# Patient Record
Sex: Male | Born: 1940 | Race: White | Hispanic: No | Marital: Married | Smoking: Never smoker
Health system: Southern US, Community
[De-identification: ages and names within clinical notes are randomized; demographics above are authoritative.]

## PROBLEM LIST (undated history)

## (undated) DIAGNOSIS — E785 Hyperlipidemia, unspecified: Secondary | ICD-10-CM

## (undated) DIAGNOSIS — Z789 Other specified health status: Secondary | ICD-10-CM

## (undated) DIAGNOSIS — F1090 Alcohol use, unspecified, uncomplicated: Secondary | ICD-10-CM

## (undated) DIAGNOSIS — G250 Essential tremor: Secondary | ICD-10-CM

## (undated) DIAGNOSIS — I10 Essential (primary) hypertension: Secondary | ICD-10-CM

## (undated) HISTORY — DX: Other specified health status: Z78.9

## (undated) HISTORY — DX: Essential tremor: G25.0

## (undated) HISTORY — DX: Hyperlipidemia, unspecified: E78.5

## (undated) HISTORY — DX: Alcohol use, unspecified, uncomplicated: F10.90

---

## 2001-01-18 ENCOUNTER — Encounter: Admission: RE | Admit: 2001-01-18 | Discharge: 2001-04-18 | Payer: Self-pay | Admitting: Dermatology

## 2002-10-12 ENCOUNTER — Emergency Department (HOSPITAL_COMMUNITY): Admission: EM | Admit: 2002-10-12 | Discharge: 2002-10-12 | Payer: Self-pay | Admitting: Emergency Medicine

## 2002-12-23 ENCOUNTER — Ambulatory Visit (HOSPITAL_COMMUNITY): Admission: RE | Admit: 2002-12-23 | Discharge: 2002-12-23 | Payer: Self-pay | Admitting: Gastroenterology

## 2002-12-23 ENCOUNTER — Encounter (INDEPENDENT_AMBULATORY_CARE_PROVIDER_SITE_OTHER): Payer: Self-pay | Admitting: Specialist

## 2006-01-02 ENCOUNTER — Encounter: Admission: RE | Admit: 2006-01-02 | Discharge: 2006-01-02 | Payer: Self-pay | Admitting: Geriatric Medicine

## 2008-09-10 ENCOUNTER — Encounter: Admission: RE | Admit: 2008-09-10 | Discharge: 2008-09-10 | Payer: Self-pay | Admitting: Geriatric Medicine

## 2009-11-05 ENCOUNTER — Inpatient Hospital Stay (HOSPITAL_COMMUNITY): Admission: RE | Admit: 2009-11-05 | Discharge: 2009-11-07 | Payer: Self-pay | Admitting: Orthopedic Surgery

## 2011-03-01 LAB — BASIC METABOLIC PANEL
BUN: 11 mg/dL (ref 6–23)
BUN: 11 mg/dL (ref 6–23)
GFR calc Af Amer: >60 mL/min (ref >60)
GFR calc non Af Amer: >60 mL/min (ref >60)
Potassium: 3.6 mEq/L (ref 3.5–5.1)
Potassium: 3.7 mEq/L (ref 3.5–5.1)

## 2011-03-01 LAB — CBC
HCT: 33.4 % — ABNORMAL LOW (ref 39.0–52.0)
HCT: 47.5 % (ref 39.0–52.0)
Hemoglobin: 12.1 g/dL — ABNORMAL LOW (ref 13.0–17.0)
MCHC: 33.6 g/dL (ref 30.0–36.0)
MCHC: 33.9 g/dL (ref 30.0–36.0)
MCHC: 34.3 g/dL (ref 30.0–36.0)
MCV: 98.1 fL (ref 78.0–100.0)
Platelets: 132 10*3/uL — ABNORMAL LOW (ref 150–400)
RBC: 3.64 MIL/uL — ABNORMAL LOW (ref 4.22–5.81)
RDW: 13 % (ref 11.5–15.5)
WBC: 4.8 10*3/uL (ref 4.0–10.5)
WBC: 6.2 10*3/uL (ref 4.0–10.5)

## 2011-03-01 LAB — PROTIME-INR: Prothrombin Time: 13.5 seconds (ref 11.6–15.2)

## 2011-03-01 LAB — ABO/RH: ABO/RH(D): O POS

## 2011-03-01 LAB — COMPREHENSIVE METABOLIC PANEL
ALT: 22 U/L (ref 0–53)
Albumin: 4.1 g/dL (ref 3.5–5.2)
Alkaline Phosphatase: 45 U/L (ref 39–117)
CO2: 29 mEq/L (ref 19–32)
Calcium: 9.4 mg/dL (ref 8.4–10.5)
Chloride: 105 mEq/L (ref 96–112)
Creatinine, Ser: 1.01 mg/dL (ref 0.4–1.5)
GFR calc non Af Amer: >60 mL/min (ref >60)
Sodium: 142 mEq/L (ref 135–145)
Total Bilirubin: 1.4 mg/dL — ABNORMAL HIGH (ref 0.3–1.2)

## 2011-03-01 LAB — DIFFERENTIAL
Basophils Absolute: 0 10*3/uL (ref 0.0–0.1)
Neutro Abs: 2.3 10*3/uL (ref 1.7–7.7)
Neutrophils Relative %: 49 % (ref 43–77)

## 2011-03-01 LAB — URINALYSIS, ROUTINE W REFLEX MICROSCOPIC
Bilirubin Urine: NEGATIVE
Glucose, UA: NEGATIVE mg/dL
Ketones, ur: NEGATIVE mg/dL
Protein, ur: NEGATIVE mg/dL
Urobilinogen, UA: 0.2 mg/dL (ref 0.0–1.0)
pH: 6.5 (ref 5.0–8.0)

## 2011-03-01 LAB — CROSSMATCH

## 2011-04-15 NOTE — Op Note (Signed)
   [name], Victor Mcbride                        ACCOUNT NO.:  [id number]   MEDICAL RECORD NO.:  [id number]                   PATIENT TYPE:  AMB   LOCATION:  ENDO                                 FACILITY:  [facility]   PHYSICIAN:  Danise Edge, M.D.                DATE OF BIRTH:  [birth year]   DATE OF PROCEDURE:  12/23/2002  DATE OF DISCHARGE:                                 OPERATIVE REPORT   REFERRING PHYSICIAN:  Hal T. Stoneking, M.D.   PROCEDURE:  Screening colonoscopy.   PROCEDURE INDICATION:  The patient is a 70 year old male, born 07/26/1941.  The  patient is scheduled undergo his first screening colonoscopy with  polypectomy to prevent colon cancer.  By screening flexible  proctosigmoidoscopy, the patient had left colonic diverticulosis.   I have discussed with the patient the complications associated with  colonoscopy and polypectomy including a 15:1000 risk of bleeding and 1:1000  risk of colon perforation.  The patient has signed the operative permit.   ENDOSCOPIST:  Danise Edge, M.D.   PREMEDICATION:  Versed 7 mg, Demerol 50 mg.   ENDOSCOPE:  Olympus pediatric colonoscope.   DESCRIPTION OF PROCEDURE:  After obtaining informed consent, the patient was  placed in the left lateral decubitus position.  I administered intravenous  Demerol and intravenous Versed to achieve conscious sedation for the  procedure.  The patient's blood pressure, oxygen saturation, and cardiac  rhythm were monitored throughout the procedure and documented in the medical  record.   Anal inspection was normal.  Digital rectal exam revealed a non-nodular  prostate.  The Olympus pediatric videocolonoscope was introduced into the  rectum and advanced to the cecum.  The colonic preparation for the exam  today was excellent.   Rectum:  Normal.   Sigmoid colon and descending colon:  Extensive left colonic diverticulosis.  At 30 cm from the anal verge, a diminutive polyp was removed with the cold  biopsy forceps.   Splenic flexure:  Normal.   Transverse colon:  Normal.   Hepatic flexure:  Normal.   Ascending colon:  Normal.   Cecum and ileocecal valve:  Normal.   ASSESSMENT:  1. A diminutive polyp was removed from the distal sigmoid colon.  2. Extensive left colonic diverticulosis.   RECOMMENDATIONS:  If sigmoid colon polyp returns neoplastic pathologically,  the patient should undergo a repeat colonoscopy in five years.                                               Danise Edge, M.D.    MJ/MEDQ  D:  12/23/2002  T:  12/23/2002  Job:  [id number]   cc:   Hal T. Stoneking, M.D.  [address] [place], [place] [address]  Fax: [phone]

## 2012-01-17 DIAGNOSIS — M25519 Pain in unspecified shoulder: Secondary | ICD-10-CM | POA: Diagnosis not present

## 2012-02-07 DIAGNOSIS — M19019 Primary osteoarthritis, unspecified shoulder: Secondary | ICD-10-CM | POA: Diagnosis not present

## 2012-02-10 DIAGNOSIS — M25519 Pain in unspecified shoulder: Secondary | ICD-10-CM | POA: Diagnosis not present

## 2012-02-15 DIAGNOSIS — M25519 Pain in unspecified shoulder: Secondary | ICD-10-CM | POA: Diagnosis not present

## 2012-02-15 DIAGNOSIS — I10 Essential (primary) hypertension: Secondary | ICD-10-CM | POA: Diagnosis not present

## 2012-02-20 DIAGNOSIS — J40 Bronchitis, not specified as acute or chronic: Secondary | ICD-10-CM | POA: Diagnosis not present

## 2012-03-15 DIAGNOSIS — M19019 Primary osteoarthritis, unspecified shoulder: Secondary | ICD-10-CM | POA: Diagnosis not present

## 2012-03-15 DIAGNOSIS — M75100 Unspecified rotator cuff tear or rupture of unspecified shoulder, not specified as traumatic: Secondary | ICD-10-CM | POA: Diagnosis not present

## 2012-03-15 DIAGNOSIS — S43429A Sprain of unspecified rotator cuff capsule, initial encounter: Secondary | ICD-10-CM | POA: Diagnosis not present

## 2012-03-15 DIAGNOSIS — M659 Synovitis and tenosynovitis, unspecified: Secondary | ICD-10-CM | POA: Diagnosis not present

## 2012-03-15 DIAGNOSIS — M9420 Chondromalacia, unspecified site: Secondary | ICD-10-CM | POA: Diagnosis not present

## 2012-03-15 DIAGNOSIS — M719 Bursopathy, unspecified: Secondary | ICD-10-CM | POA: Diagnosis not present

## 2012-03-15 DIAGNOSIS — M67919 Unspecified disorder of synovium and tendon, unspecified shoulder: Secondary | ICD-10-CM | POA: Diagnosis not present

## 2012-03-26 DIAGNOSIS — M25669 Stiffness of unspecified knee, not elsewhere classified: Secondary | ICD-10-CM | POA: Diagnosis not present

## 2012-03-26 DIAGNOSIS — M629 Disorder of muscle, unspecified: Secondary | ICD-10-CM | POA: Diagnosis not present

## 2012-03-26 DIAGNOSIS — S43429A Sprain of unspecified rotator cuff capsule, initial encounter: Secondary | ICD-10-CM | POA: Diagnosis not present

## 2012-03-30 DIAGNOSIS — M25669 Stiffness of unspecified knee, not elsewhere classified: Secondary | ICD-10-CM | POA: Diagnosis not present

## 2012-03-30 DIAGNOSIS — S43429A Sprain of unspecified rotator cuff capsule, initial encounter: Secondary | ICD-10-CM | POA: Diagnosis not present

## 2012-03-30 DIAGNOSIS — M629 Disorder of muscle, unspecified: Secondary | ICD-10-CM | POA: Diagnosis not present

## 2012-04-03 DIAGNOSIS — M25669 Stiffness of unspecified knee, not elsewhere classified: Secondary | ICD-10-CM | POA: Diagnosis not present

## 2012-04-03 DIAGNOSIS — S43429A Sprain of unspecified rotator cuff capsule, initial encounter: Secondary | ICD-10-CM | POA: Diagnosis not present

## 2012-04-03 DIAGNOSIS — M629 Disorder of muscle, unspecified: Secondary | ICD-10-CM | POA: Diagnosis not present

## 2012-04-05 DIAGNOSIS — M629 Disorder of muscle, unspecified: Secondary | ICD-10-CM | POA: Diagnosis not present

## 2012-04-05 DIAGNOSIS — S43429A Sprain of unspecified rotator cuff capsule, initial encounter: Secondary | ICD-10-CM | POA: Diagnosis not present

## 2012-04-05 DIAGNOSIS — M25669 Stiffness of unspecified knee, not elsewhere classified: Secondary | ICD-10-CM | POA: Diagnosis not present

## 2012-04-09 DIAGNOSIS — M629 Disorder of muscle, unspecified: Secondary | ICD-10-CM | POA: Diagnosis not present

## 2012-04-09 DIAGNOSIS — M25669 Stiffness of unspecified knee, not elsewhere classified: Secondary | ICD-10-CM | POA: Diagnosis not present

## 2012-04-12 DIAGNOSIS — M25669 Stiffness of unspecified knee, not elsewhere classified: Secondary | ICD-10-CM | POA: Diagnosis not present

## 2012-04-12 DIAGNOSIS — S43429A Sprain of unspecified rotator cuff capsule, initial encounter: Secondary | ICD-10-CM | POA: Diagnosis not present

## 2012-04-12 DIAGNOSIS — M629 Disorder of muscle, unspecified: Secondary | ICD-10-CM | POA: Diagnosis not present

## 2012-04-16 DIAGNOSIS — M629 Disorder of muscle, unspecified: Secondary | ICD-10-CM | POA: Diagnosis not present

## 2012-04-16 DIAGNOSIS — M25669 Stiffness of unspecified knee, not elsewhere classified: Secondary | ICD-10-CM | POA: Diagnosis not present

## 2012-04-16 DIAGNOSIS — S43429A Sprain of unspecified rotator cuff capsule, initial encounter: Secondary | ICD-10-CM | POA: Diagnosis not present

## 2012-04-19 DIAGNOSIS — M629 Disorder of muscle, unspecified: Secondary | ICD-10-CM | POA: Diagnosis not present

## 2012-04-19 DIAGNOSIS — M25669 Stiffness of unspecified knee, not elsewhere classified: Secondary | ICD-10-CM | POA: Diagnosis not present

## 2012-04-19 DIAGNOSIS — S43429A Sprain of unspecified rotator cuff capsule, initial encounter: Secondary | ICD-10-CM | POA: Diagnosis not present

## 2012-04-24 DIAGNOSIS — M629 Disorder of muscle, unspecified: Secondary | ICD-10-CM | POA: Diagnosis not present

## 2012-04-24 DIAGNOSIS — M25669 Stiffness of unspecified knee, not elsewhere classified: Secondary | ICD-10-CM | POA: Diagnosis not present

## 2012-04-24 DIAGNOSIS — S43429A Sprain of unspecified rotator cuff capsule, initial encounter: Secondary | ICD-10-CM | POA: Diagnosis not present

## 2012-04-27 DIAGNOSIS — M25669 Stiffness of unspecified knee, not elsewhere classified: Secondary | ICD-10-CM | POA: Diagnosis not present

## 2012-04-27 DIAGNOSIS — M629 Disorder of muscle, unspecified: Secondary | ICD-10-CM | POA: Diagnosis not present

## 2012-04-27 DIAGNOSIS — S43429A Sprain of unspecified rotator cuff capsule, initial encounter: Secondary | ICD-10-CM | POA: Diagnosis not present

## 2012-05-01 DIAGNOSIS — M629 Disorder of muscle, unspecified: Secondary | ICD-10-CM | POA: Diagnosis not present

## 2012-05-01 DIAGNOSIS — M25669 Stiffness of unspecified knee, not elsewhere classified: Secondary | ICD-10-CM | POA: Diagnosis not present

## 2012-05-01 DIAGNOSIS — S43429A Sprain of unspecified rotator cuff capsule, initial encounter: Secondary | ICD-10-CM | POA: Diagnosis not present

## 2012-05-03 DIAGNOSIS — M25669 Stiffness of unspecified knee, not elsewhere classified: Secondary | ICD-10-CM | POA: Diagnosis not present

## 2012-05-03 DIAGNOSIS — M629 Disorder of muscle, unspecified: Secondary | ICD-10-CM | POA: Diagnosis not present

## 2012-05-03 DIAGNOSIS — S43429A Sprain of unspecified rotator cuff capsule, initial encounter: Secondary | ICD-10-CM | POA: Diagnosis not present

## 2012-05-04 DIAGNOSIS — D485 Neoplasm of uncertain behavior of skin: Secondary | ICD-10-CM | POA: Diagnosis not present

## 2012-05-04 DIAGNOSIS — Z79899 Other long term (current) drug therapy: Secondary | ICD-10-CM | POA: Diagnosis not present

## 2012-05-04 DIAGNOSIS — E7800 Pure hypercholesterolemia, unspecified: Secondary | ICD-10-CM | POA: Diagnosis not present

## 2012-05-04 DIAGNOSIS — L570 Actinic keratosis: Secondary | ICD-10-CM | POA: Diagnosis not present

## 2012-05-04 DIAGNOSIS — I10 Essential (primary) hypertension: Secondary | ICD-10-CM | POA: Diagnosis not present

## 2012-05-04 DIAGNOSIS — D235 Other benign neoplasm of skin of trunk: Secondary | ICD-10-CM | POA: Diagnosis not present

## 2012-05-10 DIAGNOSIS — M25519 Pain in unspecified shoulder: Secondary | ICD-10-CM | POA: Diagnosis not present

## 2012-05-10 DIAGNOSIS — Z4789 Encounter for other orthopedic aftercare: Secondary | ICD-10-CM | POA: Diagnosis not present

## 2012-05-14 DIAGNOSIS — M25519 Pain in unspecified shoulder: Secondary | ICD-10-CM | POA: Diagnosis not present

## 2012-05-14 DIAGNOSIS — Z4789 Encounter for other orthopedic aftercare: Secondary | ICD-10-CM | POA: Diagnosis not present

## 2012-05-18 DIAGNOSIS — Z4789 Encounter for other orthopedic aftercare: Secondary | ICD-10-CM | POA: Diagnosis not present

## 2012-05-18 DIAGNOSIS — M25519 Pain in unspecified shoulder: Secondary | ICD-10-CM | POA: Diagnosis not present

## 2012-05-28 DIAGNOSIS — M25519 Pain in unspecified shoulder: Secondary | ICD-10-CM | POA: Diagnosis not present

## 2012-05-28 DIAGNOSIS — Z4789 Encounter for other orthopedic aftercare: Secondary | ICD-10-CM | POA: Diagnosis not present

## 2012-06-01 DIAGNOSIS — Z4789 Encounter for other orthopedic aftercare: Secondary | ICD-10-CM | POA: Diagnosis not present

## 2012-06-01 DIAGNOSIS — M25519 Pain in unspecified shoulder: Secondary | ICD-10-CM | POA: Diagnosis not present

## 2012-06-05 DIAGNOSIS — M25519 Pain in unspecified shoulder: Secondary | ICD-10-CM | POA: Diagnosis not present

## 2012-06-05 DIAGNOSIS — Z4789 Encounter for other orthopedic aftercare: Secondary | ICD-10-CM | POA: Diagnosis not present

## 2012-06-18 DIAGNOSIS — M25519 Pain in unspecified shoulder: Secondary | ICD-10-CM | POA: Diagnosis not present

## 2012-06-21 DIAGNOSIS — M25519 Pain in unspecified shoulder: Secondary | ICD-10-CM | POA: Diagnosis not present

## 2012-06-21 DIAGNOSIS — Z4789 Encounter for other orthopedic aftercare: Secondary | ICD-10-CM | POA: Diagnosis not present

## 2012-08-01 DIAGNOSIS — L905 Scar conditions and fibrosis of skin: Secondary | ICD-10-CM | POA: Diagnosis not present

## 2012-08-01 DIAGNOSIS — L570 Actinic keratosis: Secondary | ICD-10-CM | POA: Diagnosis not present

## 2012-09-11 DIAGNOSIS — Z23 Encounter for immunization: Secondary | ICD-10-CM | POA: Diagnosis not present

## 2012-10-31 DIAGNOSIS — I10 Essential (primary) hypertension: Secondary | ICD-10-CM | POA: Diagnosis not present

## 2012-10-31 DIAGNOSIS — Z1331 Encounter for screening for depression: Secondary | ICD-10-CM | POA: Diagnosis not present

## 2012-10-31 DIAGNOSIS — K219 Gastro-esophageal reflux disease without esophagitis: Secondary | ICD-10-CM | POA: Diagnosis not present

## 2012-10-31 DIAGNOSIS — Z79899 Other long term (current) drug therapy: Secondary | ICD-10-CM | POA: Diagnosis not present

## 2012-10-31 DIAGNOSIS — Z0000 Encounter for general adult medical examination without abnormal findings: Secondary | ICD-10-CM | POA: Diagnosis not present

## 2012-10-31 DIAGNOSIS — E7800 Pure hypercholesterolemia, unspecified: Secondary | ICD-10-CM | POA: Diagnosis not present

## 2012-11-02 DIAGNOSIS — I10 Essential (primary) hypertension: Secondary | ICD-10-CM | POA: Diagnosis not present

## 2012-11-02 DIAGNOSIS — Z79899 Other long term (current) drug therapy: Secondary | ICD-10-CM | POA: Diagnosis not present

## 2012-11-02 DIAGNOSIS — E7800 Pure hypercholesterolemia, unspecified: Secondary | ICD-10-CM | POA: Diagnosis not present

## 2012-11-02 DIAGNOSIS — L570 Actinic keratosis: Secondary | ICD-10-CM | POA: Diagnosis not present

## 2012-11-08 DIAGNOSIS — H25099 Other age-related incipient cataract, unspecified eye: Secondary | ICD-10-CM | POA: Diagnosis not present

## 2012-11-08 DIAGNOSIS — H52229 Regular astigmatism, unspecified eye: Secondary | ICD-10-CM | POA: Diagnosis not present

## 2012-11-08 DIAGNOSIS — H524 Presbyopia: Secondary | ICD-10-CM | POA: Diagnosis not present

## 2012-11-08 DIAGNOSIS — H5210 Myopia, unspecified eye: Secondary | ICD-10-CM | POA: Diagnosis not present

## 2013-01-14 DIAGNOSIS — L130 Dermatitis herpetiformis: Secondary | ICD-10-CM | POA: Diagnosis not present

## 2013-04-26 DIAGNOSIS — R5383 Other fatigue: Secondary | ICD-10-CM | POA: Diagnosis not present

## 2013-04-26 DIAGNOSIS — I10 Essential (primary) hypertension: Secondary | ICD-10-CM | POA: Diagnosis not present

## 2013-04-26 DIAGNOSIS — L570 Actinic keratosis: Secondary | ICD-10-CM | POA: Diagnosis not present

## 2013-04-26 DIAGNOSIS — E7800 Pure hypercholesterolemia, unspecified: Secondary | ICD-10-CM | POA: Diagnosis not present

## 2013-04-26 DIAGNOSIS — D235 Other benign neoplasm of skin of trunk: Secondary | ICD-10-CM | POA: Diagnosis not present

## 2013-04-26 DIAGNOSIS — Z79899 Other long term (current) drug therapy: Secondary | ICD-10-CM | POA: Diagnosis not present

## 2013-04-26 DIAGNOSIS — L819 Disorder of pigmentation, unspecified: Secondary | ICD-10-CM | POA: Diagnosis not present

## 2013-08-28 DIAGNOSIS — Z23 Encounter for immunization: Secondary | ICD-10-CM | POA: Diagnosis not present

## 2013-09-18 DIAGNOSIS — M542 Cervicalgia: Secondary | ICD-10-CM | POA: Diagnosis not present

## 2013-11-01 DIAGNOSIS — I10 Essential (primary) hypertension: Secondary | ICD-10-CM | POA: Diagnosis not present

## 2013-11-01 DIAGNOSIS — Z1331 Encounter for screening for depression: Secondary | ICD-10-CM | POA: Diagnosis not present

## 2013-11-01 DIAGNOSIS — L821 Other seborrheic keratosis: Secondary | ICD-10-CM | POA: Diagnosis not present

## 2013-11-01 DIAGNOSIS — Z79899 Other long term (current) drug therapy: Secondary | ICD-10-CM | POA: Diagnosis not present

## 2013-11-01 DIAGNOSIS — E7800 Pure hypercholesterolemia, unspecified: Secondary | ICD-10-CM | POA: Diagnosis not present

## 2013-11-01 DIAGNOSIS — Z0000 Encounter for general adult medical examination without abnormal findings: Secondary | ICD-10-CM | POA: Diagnosis not present

## 2013-11-01 DIAGNOSIS — L570 Actinic keratosis: Secondary | ICD-10-CM | POA: Diagnosis not present

## 2013-11-14 DIAGNOSIS — H524 Presbyopia: Secondary | ICD-10-CM | POA: Diagnosis not present

## 2013-11-14 DIAGNOSIS — H52229 Regular astigmatism, unspecified eye: Secondary | ICD-10-CM | POA: Diagnosis not present

## 2013-11-14 DIAGNOSIS — H5210 Myopia, unspecified eye: Secondary | ICD-10-CM | POA: Diagnosis not present

## 2013-11-14 DIAGNOSIS — H25099 Other age-related incipient cataract, unspecified eye: Secondary | ICD-10-CM | POA: Diagnosis not present

## 2014-02-20 DIAGNOSIS — K5730 Diverticulosis of large intestine without perforation or abscess without bleeding: Secondary | ICD-10-CM | POA: Diagnosis not present

## 2014-02-20 DIAGNOSIS — Z86010 Personal history of colonic polyps: Secondary | ICD-10-CM | POA: Diagnosis not present

## 2014-02-20 DIAGNOSIS — Z09 Encounter for follow-up examination after completed treatment for conditions other than malignant neoplasm: Secondary | ICD-10-CM | POA: Diagnosis not present

## 2014-04-25 DIAGNOSIS — D235 Other benign neoplasm of skin of trunk: Secondary | ICD-10-CM | POA: Diagnosis not present

## 2014-04-25 DIAGNOSIS — E7800 Pure hypercholesterolemia, unspecified: Secondary | ICD-10-CM | POA: Diagnosis not present

## 2014-04-25 DIAGNOSIS — Z79899 Other long term (current) drug therapy: Secondary | ICD-10-CM | POA: Diagnosis not present

## 2014-04-25 DIAGNOSIS — L821 Other seborrheic keratosis: Secondary | ICD-10-CM | POA: Diagnosis not present

## 2014-04-25 DIAGNOSIS — L570 Actinic keratosis: Secondary | ICD-10-CM | POA: Diagnosis not present

## 2014-04-25 DIAGNOSIS — L819 Disorder of pigmentation, unspecified: Secondary | ICD-10-CM | POA: Diagnosis not present

## 2014-04-25 DIAGNOSIS — I10 Essential (primary) hypertension: Secondary | ICD-10-CM | POA: Diagnosis not present

## 2014-06-05 DIAGNOSIS — L259 Unspecified contact dermatitis, unspecified cause: Secondary | ICD-10-CM | POA: Diagnosis not present

## 2014-09-04 DIAGNOSIS — Z23 Encounter for immunization: Secondary | ICD-10-CM | POA: Diagnosis not present

## 2014-10-17 DIAGNOSIS — L570 Actinic keratosis: Secondary | ICD-10-CM | POA: Diagnosis not present

## 2014-11-06 DIAGNOSIS — Z1389 Encounter for screening for other disorder: Secondary | ICD-10-CM | POA: Diagnosis not present

## 2014-11-06 DIAGNOSIS — H9319 Tinnitus, unspecified ear: Secondary | ICD-10-CM | POA: Diagnosis not present

## 2014-11-06 DIAGNOSIS — M25552 Pain in left hip: Secondary | ICD-10-CM | POA: Diagnosis not present

## 2014-11-06 DIAGNOSIS — G3184 Mild cognitive impairment, so stated: Secondary | ICD-10-CM | POA: Diagnosis not present

## 2014-11-06 DIAGNOSIS — E780 Pure hypercholesterolemia: Secondary | ICD-10-CM | POA: Diagnosis not present

## 2014-11-06 DIAGNOSIS — Z0000 Encounter for general adult medical examination without abnormal findings: Secondary | ICD-10-CM | POA: Diagnosis not present

## 2014-11-06 DIAGNOSIS — Z23 Encounter for immunization: Secondary | ICD-10-CM | POA: Diagnosis not present

## 2014-11-06 DIAGNOSIS — Z79899 Other long term (current) drug therapy: Secondary | ICD-10-CM | POA: Diagnosis not present

## 2014-11-06 DIAGNOSIS — I10 Essential (primary) hypertension: Secondary | ICD-10-CM | POA: Diagnosis not present

## 2014-11-07 DIAGNOSIS — I10 Essential (primary) hypertension: Secondary | ICD-10-CM | POA: Diagnosis not present

## 2014-11-07 DIAGNOSIS — E780 Pure hypercholesterolemia: Secondary | ICD-10-CM | POA: Diagnosis not present

## 2014-11-07 DIAGNOSIS — Z79899 Other long term (current) drug therapy: Secondary | ICD-10-CM | POA: Diagnosis not present

## 2014-12-05 DIAGNOSIS — H52223 Regular astigmatism, bilateral: Secondary | ICD-10-CM | POA: Diagnosis not present

## 2014-12-05 DIAGNOSIS — H524 Presbyopia: Secondary | ICD-10-CM | POA: Diagnosis not present

## 2014-12-05 DIAGNOSIS — H2513 Age-related nuclear cataract, bilateral: Secondary | ICD-10-CM | POA: Diagnosis not present

## 2014-12-05 DIAGNOSIS — H5213 Myopia, bilateral: Secondary | ICD-10-CM | POA: Diagnosis not present

## 2015-03-10 ENCOUNTER — Emergency Department (HOSPITAL_COMMUNITY)
Admission: EM | Admit: 2015-03-10 | Discharge: 2015-03-10 | Disposition: A | Discharge status: Home / Self Care | Payer: No Typology Code available for payment source | Attending: Emergency Medicine | Admitting: Emergency Medicine

## 2015-03-10 ENCOUNTER — Encounter (HOSPITAL_COMMUNITY): Payer: Self-pay | Admitting: Emergency Medicine

## 2015-03-10 ENCOUNTER — Emergency Department (HOSPITAL_COMMUNITY): Payer: No Typology Code available for payment source

## 2015-03-10 DIAGNOSIS — R0781 Pleurodynia: Secondary | ICD-10-CM

## 2015-03-10 DIAGNOSIS — Y92410 Unspecified street and highway as the place of occurrence of the external cause: Secondary | ICD-10-CM | POA: Diagnosis not present

## 2015-03-10 DIAGNOSIS — I10 Essential (primary) hypertension: Secondary | ICD-10-CM | POA: Diagnosis not present

## 2015-03-10 DIAGNOSIS — S299XXA Unspecified injury of thorax, initial encounter: Secondary | ICD-10-CM | POA: Diagnosis not present

## 2015-03-10 DIAGNOSIS — Z79899 Other long term (current) drug therapy: Secondary | ICD-10-CM | POA: Insufficient documentation

## 2015-03-10 DIAGNOSIS — Y998 Other external cause status: Secondary | ICD-10-CM | POA: Diagnosis not present

## 2015-03-10 DIAGNOSIS — Y9389 Activity, other specified: Secondary | ICD-10-CM | POA: Diagnosis not present

## 2015-03-10 DIAGNOSIS — Z7982 Long term (current) use of aspirin: Secondary | ICD-10-CM | POA: Insufficient documentation

## 2015-03-10 HISTORY — DX: Essential (primary) hypertension: I10
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
## 2015-03-10 NOTE — Discharge Instructions (Signed)
If you were given medicines take as directed.  If you are on coumadin or contraceptives realize their levels and effectiveness is altered by many different medicines.  If you have any reaction (rash, tongues swelling, other) to the medicines stop taking and see a physician.   Please follow up as directed and return to the ER or see a physician for new or worsening symptoms.  Thank you. Filed Vitals:   03/10/15 1244 03/10/15 1519 03/10/15 1530  BP: 172/81 159/79 150/72  Pulse: 52  53  Temp: 98.3 F (36.8 C) 98.3 F (36.8 C)   TempSrc: Oral Oral   Resp: 18 16 19   SpO2: 97% 98% 96%   Have a great trip!
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
## 2015-03-30 DIAGNOSIS — T148 Other injury of unspecified body region: Secondary | ICD-10-CM | POA: Diagnosis not present

## 2015-04-17 DIAGNOSIS — L814 Other melanin hyperpigmentation: Secondary | ICD-10-CM | POA: Diagnosis not present

## 2015-04-17 DIAGNOSIS — D1801 Hemangioma of skin and subcutaneous tissue: Secondary | ICD-10-CM | POA: Diagnosis not present

## 2015-04-17 DIAGNOSIS — E780 Pure hypercholesterolemia: Secondary | ICD-10-CM | POA: Diagnosis not present

## 2015-04-17 DIAGNOSIS — I10 Essential (primary) hypertension: Secondary | ICD-10-CM | POA: Diagnosis not present

## 2015-04-17 DIAGNOSIS — L570 Actinic keratosis: Secondary | ICD-10-CM | POA: Diagnosis not present

## 2015-04-17 DIAGNOSIS — L821 Other seborrheic keratosis: Secondary | ICD-10-CM | POA: Diagnosis not present

## 2015-04-17 DIAGNOSIS — D225 Melanocytic nevi of trunk: Secondary | ICD-10-CM | POA: Diagnosis not present

## 2015-09-01 DIAGNOSIS — Z23 Encounter for immunization: Secondary | ICD-10-CM | POA: Diagnosis not present

## 2015-10-28 DIAGNOSIS — L570 Actinic keratosis: Secondary | ICD-10-CM | POA: Diagnosis not present

## 2015-11-11 DIAGNOSIS — M7130 Other bursal cyst, unspecified site: Secondary | ICD-10-CM | POA: Diagnosis not present

## 2015-11-11 DIAGNOSIS — E7800 Pure hypercholesterolemia, unspecified: Secondary | ICD-10-CM | POA: Diagnosis not present

## 2015-11-11 DIAGNOSIS — I10 Essential (primary) hypertension: Secondary | ICD-10-CM | POA: Diagnosis not present

## 2015-11-11 DIAGNOSIS — Z1389 Encounter for screening for other disorder: Secondary | ICD-10-CM | POA: Diagnosis not present

## 2015-11-11 DIAGNOSIS — Z0000 Encounter for general adult medical examination without abnormal findings: Secondary | ICD-10-CM | POA: Diagnosis not present

## 2015-11-11 DIAGNOSIS — Z79899 Other long term (current) drug therapy: Secondary | ICD-10-CM | POA: Diagnosis not present

## 2015-11-11 DIAGNOSIS — M109 Gout, unspecified: Secondary | ICD-10-CM | POA: Diagnosis not present

## 2015-11-11 DIAGNOSIS — G250 Essential tremor: Secondary | ICD-10-CM | POA: Diagnosis not present

## 2015-12-17 DIAGNOSIS — H2513 Age-related nuclear cataract, bilateral: Secondary | ICD-10-CM | POA: Diagnosis not present

## 2015-12-17 DIAGNOSIS — H5213 Myopia, bilateral: Secondary | ICD-10-CM | POA: Diagnosis not present

## 2016-05-03 DIAGNOSIS — I10 Essential (primary) hypertension: Secondary | ICD-10-CM | POA: Diagnosis not present

## 2016-06-28 DIAGNOSIS — S7011XA Contusion of right thigh, initial encounter: Secondary | ICD-10-CM | POA: Diagnosis not present

## 2016-06-28 DIAGNOSIS — S46911A Strain of unspecified muscle, fascia and tendon at shoulder and upper arm level, right arm, initial encounter: Secondary | ICD-10-CM | POA: Diagnosis not present

## 2016-06-28 DIAGNOSIS — S79821A Other specified injuries of right thigh, initial encounter: Secondary | ICD-10-CM | POA: Diagnosis not present

## 2016-06-28 DIAGNOSIS — Z7982 Long term (current) use of aspirin: Secondary | ICD-10-CM | POA: Diagnosis not present

## 2016-06-28 DIAGNOSIS — Z79899 Other long term (current) drug therapy: Secondary | ICD-10-CM | POA: Diagnosis not present

## 2016-06-28 DIAGNOSIS — S4992XA Unspecified injury of left shoulder and upper arm, initial encounter: Secondary | ICD-10-CM | POA: Diagnosis not present

## 2016-06-28 DIAGNOSIS — S70311A Abrasion, right thigh, initial encounter: Secondary | ICD-10-CM | POA: Diagnosis not present

## 2016-06-28 DIAGNOSIS — I10 Essential (primary) hypertension: Secondary | ICD-10-CM | POA: Diagnosis not present

## 2016-06-28 DIAGNOSIS — W228XXA Striking against or struck by other objects, initial encounter: Secondary | ICD-10-CM | POA: Diagnosis not present

## 2016-06-28 DIAGNOSIS — S46812A Strain of other muscles, fascia and tendons at shoulder and upper arm level, left arm, initial encounter: Secondary | ICD-10-CM | POA: Diagnosis not present

## 2016-07-10 DIAGNOSIS — S7011XD Contusion of right thigh, subsequent encounter: Secondary | ICD-10-CM | POA: Diagnosis not present

## 2016-07-10 DIAGNOSIS — S46812D Strain of other muscles, fascia and tendons at shoulder and upper arm level, left arm, subsequent encounter: Secondary | ICD-10-CM | POA: Diagnosis not present

## 2016-07-21 DIAGNOSIS — Z79899 Other long term (current) drug therapy: Secondary | ICD-10-CM | POA: Diagnosis not present

## 2016-07-21 DIAGNOSIS — Z7982 Long term (current) use of aspirin: Secondary | ICD-10-CM | POA: Diagnosis not present

## 2016-07-21 DIAGNOSIS — W1839XA Other fall on same level, initial encounter: Secondary | ICD-10-CM | POA: Diagnosis not present

## 2016-07-21 DIAGNOSIS — S2241XA Multiple fractures of ribs, right side, initial encounter for closed fracture: Secondary | ICD-10-CM | POA: Diagnosis not present

## 2016-07-21 DIAGNOSIS — I10 Essential (primary) hypertension: Secondary | ICD-10-CM | POA: Diagnosis not present

## 2016-07-30 DIAGNOSIS — R0789 Other chest pain: Secondary | ICD-10-CM | POA: Diagnosis not present

## 2016-07-30 DIAGNOSIS — S2231XD Fracture of one rib, right side, subsequent encounter for fracture with routine healing: Secondary | ICD-10-CM | POA: Diagnosis not present

## 2016-07-30 DIAGNOSIS — Y93K1 Activity, walking an animal: Secondary | ICD-10-CM | POA: Diagnosis not present

## 2016-07-30 DIAGNOSIS — W19XXXA Unspecified fall, initial encounter: Secondary | ICD-10-CM | POA: Diagnosis not present

## 2016-09-14 DIAGNOSIS — Z23 Encounter for immunization: Secondary | ICD-10-CM | POA: Diagnosis not present

## 2016-10-04 DIAGNOSIS — M25512 Pain in left shoulder: Secondary | ICD-10-CM | POA: Diagnosis not present

## 2016-10-04 DIAGNOSIS — I10 Essential (primary) hypertension: Secondary | ICD-10-CM | POA: Diagnosis not present

## 2016-10-26 DIAGNOSIS — L578 Other skin changes due to chronic exposure to nonionizing radiation: Secondary | ICD-10-CM | POA: Diagnosis not present

## 2016-10-26 DIAGNOSIS — L814 Other melanin hyperpigmentation: Secondary | ICD-10-CM | POA: Diagnosis not present

## 2016-12-16 DIAGNOSIS — H2513 Age-related nuclear cataract, bilateral: Secondary | ICD-10-CM | POA: Diagnosis not present

## 2016-12-16 DIAGNOSIS — H52203 Unspecified astigmatism, bilateral: Secondary | ICD-10-CM | POA: Diagnosis not present

## 2017-01-11 DIAGNOSIS — I10 Essential (primary) hypertension: Secondary | ICD-10-CM | POA: Diagnosis not present

## 2017-01-11 DIAGNOSIS — Z1389 Encounter for screening for other disorder: Secondary | ICD-10-CM | POA: Diagnosis not present

## 2017-01-11 DIAGNOSIS — Z23 Encounter for immunization: Secondary | ICD-10-CM | POA: Diagnosis not present

## 2017-01-11 DIAGNOSIS — Z79899 Other long term (current) drug therapy: Secondary | ICD-10-CM | POA: Diagnosis not present

## 2017-01-11 DIAGNOSIS — E7800 Pure hypercholesterolemia, unspecified: Secondary | ICD-10-CM | POA: Diagnosis not present

## 2017-01-11 DIAGNOSIS — Z0000 Encounter for general adult medical examination without abnormal findings: Secondary | ICD-10-CM | POA: Diagnosis not present

## 2017-07-10 DIAGNOSIS — Z79899 Other long term (current) drug therapy: Secondary | ICD-10-CM | POA: Diagnosis not present

## 2017-07-10 DIAGNOSIS — I10 Essential (primary) hypertension: Secondary | ICD-10-CM | POA: Diagnosis not present

## 2017-09-07 DIAGNOSIS — Z23 Encounter for immunization: Secondary | ICD-10-CM | POA: Diagnosis not present

## 2017-09-18 DIAGNOSIS — I10 Essential (primary) hypertension: Secondary | ICD-10-CM | POA: Diagnosis not present

## 2017-09-18 DIAGNOSIS — Z111 Encounter for screening for respiratory tuberculosis: Secondary | ICD-10-CM | POA: Diagnosis not present

## 2017-10-25 DIAGNOSIS — L814 Other melanin hyperpigmentation: Secondary | ICD-10-CM | POA: Diagnosis not present

## 2017-10-25 DIAGNOSIS — L570 Actinic keratosis: Secondary | ICD-10-CM | POA: Diagnosis not present

## 2017-10-25 DIAGNOSIS — D225 Melanocytic nevi of trunk: Secondary | ICD-10-CM | POA: Diagnosis not present

## 2017-10-25 DIAGNOSIS — D1801 Hemangioma of skin and subcutaneous tissue: Secondary | ICD-10-CM | POA: Diagnosis not present

## 2017-10-25 DIAGNOSIS — L821 Other seborrheic keratosis: Secondary | ICD-10-CM | POA: Diagnosis not present

## 2017-12-18 DIAGNOSIS — H2513 Age-related nuclear cataract, bilateral: Secondary | ICD-10-CM | POA: Diagnosis not present

## 2017-12-18 DIAGNOSIS — H524 Presbyopia: Secondary | ICD-10-CM | POA: Diagnosis not present

## 2017-12-18 DIAGNOSIS — H52203 Unspecified astigmatism, bilateral: Secondary | ICD-10-CM | POA: Diagnosis not present

## 2018-01-24 DIAGNOSIS — Z79899 Other long term (current) drug therapy: Secondary | ICD-10-CM | POA: Diagnosis not present

## 2018-01-24 DIAGNOSIS — E7800 Pure hypercholesterolemia, unspecified: Secondary | ICD-10-CM | POA: Diagnosis not present

## 2018-01-24 DIAGNOSIS — Z1389 Encounter for screening for other disorder: Secondary | ICD-10-CM | POA: Diagnosis not present

## 2018-01-24 DIAGNOSIS — Z0000 Encounter for general adult medical examination without abnormal findings: Secondary | ICD-10-CM | POA: Diagnosis not present

## 2018-01-24 DIAGNOSIS — Z23 Encounter for immunization: Secondary | ICD-10-CM | POA: Diagnosis not present

## 2018-01-24 DIAGNOSIS — I10 Essential (primary) hypertension: Secondary | ICD-10-CM | POA: Diagnosis not present

## 2018-08-07 DIAGNOSIS — Z79899 Other long term (current) drug therapy: Secondary | ICD-10-CM | POA: Diagnosis not present

## 2018-08-07 DIAGNOSIS — Z23 Encounter for immunization: Secondary | ICD-10-CM | POA: Diagnosis not present

## 2018-08-07 DIAGNOSIS — R202 Paresthesia of skin: Secondary | ICD-10-CM | POA: Diagnosis not present

## 2018-08-07 DIAGNOSIS — I10 Essential (primary) hypertension: Secondary | ICD-10-CM | POA: Diagnosis not present

## 2018-08-07 DIAGNOSIS — R413 Other amnesia: Secondary | ICD-10-CM | POA: Diagnosis not present

## 2018-08-28 DIAGNOSIS — Z79899 Other long term (current) drug therapy: Secondary | ICD-10-CM | POA: Diagnosis not present

## 2018-08-28 DIAGNOSIS — G3184 Mild cognitive impairment, so stated: Secondary | ICD-10-CM | POA: Diagnosis not present

## 2018-08-28 DIAGNOSIS — I10 Essential (primary) hypertension: Secondary | ICD-10-CM | POA: Diagnosis not present

## 2018-08-28 DIAGNOSIS — K9089 Other intestinal malabsorption: Secondary | ICD-10-CM | POA: Diagnosis not present

## 2018-08-28 DIAGNOSIS — K219 Gastro-esophageal reflux disease without esophagitis: Secondary | ICD-10-CM | POA: Diagnosis not present

## 2018-09-26 DIAGNOSIS — I10 Essential (primary) hypertension: Secondary | ICD-10-CM | POA: Diagnosis not present

## 2018-09-26 DIAGNOSIS — R413 Other amnesia: Secondary | ICD-10-CM | POA: Diagnosis not present

## 2018-12-19 DIAGNOSIS — H00014 Hordeolum externum left upper eyelid: Secondary | ICD-10-CM | POA: Diagnosis not present

## 2018-12-19 DIAGNOSIS — H524 Presbyopia: Secondary | ICD-10-CM | POA: Diagnosis not present

## 2018-12-19 DIAGNOSIS — H2513 Age-related nuclear cataract, bilateral: Secondary | ICD-10-CM | POA: Diagnosis not present

## 2018-12-31 DIAGNOSIS — R05 Cough: Secondary | ICD-10-CM | POA: Diagnosis not present

## 2018-12-31 DIAGNOSIS — R0981 Nasal congestion: Secondary | ICD-10-CM | POA: Diagnosis not present

## 2019-01-21 DIAGNOSIS — D225 Melanocytic nevi of trunk: Secondary | ICD-10-CM | POA: Diagnosis not present

## 2019-01-21 DIAGNOSIS — L821 Other seborrheic keratosis: Secondary | ICD-10-CM | POA: Diagnosis not present

## 2019-01-21 DIAGNOSIS — L570 Actinic keratosis: Secondary | ICD-10-CM | POA: Diagnosis not present

## 2019-01-21 DIAGNOSIS — L812 Freckles: Secondary | ICD-10-CM | POA: Diagnosis not present

## 2019-02-11 DIAGNOSIS — R040 Epistaxis: Secondary | ICD-10-CM | POA: Diagnosis not present

## 2019-02-11 DIAGNOSIS — Z1389 Encounter for screening for other disorder: Secondary | ICD-10-CM | POA: Diagnosis not present

## 2019-02-11 DIAGNOSIS — I10 Essential (primary) hypertension: Secondary | ICD-10-CM | POA: Diagnosis not present

## 2019-02-11 DIAGNOSIS — Z0000 Encounter for general adult medical examination without abnormal findings: Secondary | ICD-10-CM | POA: Diagnosis not present

## 2019-02-11 DIAGNOSIS — G3184 Mild cognitive impairment, so stated: Secondary | ICD-10-CM | POA: Diagnosis not present

## 2019-03-18 DIAGNOSIS — Z5189 Encounter for other specified aftercare: Secondary | ICD-10-CM | POA: Diagnosis not present

## 2019-05-28 DIAGNOSIS — Z79899 Other long term (current) drug therapy: Secondary | ICD-10-CM | POA: Diagnosis not present

## 2019-05-28 DIAGNOSIS — F5101 Primary insomnia: Secondary | ICD-10-CM | POA: Diagnosis not present

## 2019-05-28 DIAGNOSIS — I10 Essential (primary) hypertension: Secondary | ICD-10-CM | POA: Diagnosis not present

## 2019-05-28 DIAGNOSIS — M545 Low back pain: Secondary | ICD-10-CM | POA: Diagnosis not present

## 2019-05-28 DIAGNOSIS — E7800 Pure hypercholesterolemia, unspecified: Secondary | ICD-10-CM | POA: Diagnosis not present

## 2019-08-29 DIAGNOSIS — Z23 Encounter for immunization: Secondary | ICD-10-CM | POA: Diagnosis not present

## 2019-10-22 DIAGNOSIS — M545 Low back pain: Secondary | ICD-10-CM | POA: Diagnosis not present

## 2019-10-22 DIAGNOSIS — I10 Essential (primary) hypertension: Secondary | ICD-10-CM | POA: Diagnosis not present

## 2019-12-10 DIAGNOSIS — Z23 Encounter for immunization: Secondary | ICD-10-CM | POA: Diagnosis not present

## 2019-12-23 DIAGNOSIS — H25013 Cortical age-related cataract, bilateral: Secondary | ICD-10-CM | POA: Diagnosis not present

## 2019-12-23 DIAGNOSIS — H52203 Unspecified astigmatism, bilateral: Secondary | ICD-10-CM | POA: Diagnosis not present

## 2019-12-23 DIAGNOSIS — H04121 Dry eye syndrome of right lacrimal gland: Secondary | ICD-10-CM | POA: Diagnosis not present

## 2019-12-23 DIAGNOSIS — H2513 Age-related nuclear cataract, bilateral: Secondary | ICD-10-CM | POA: Diagnosis not present

## 2020-01-07 DIAGNOSIS — Z23 Encounter for immunization: Secondary | ICD-10-CM | POA: Diagnosis not present

## 2020-01-28 DIAGNOSIS — D1801 Hemangioma of skin and subcutaneous tissue: Secondary | ICD-10-CM | POA: Diagnosis not present

## 2020-01-28 DIAGNOSIS — M7130 Other bursal cyst, unspecified site: Secondary | ICD-10-CM | POA: Diagnosis not present

## 2020-01-28 DIAGNOSIS — L812 Freckles: Secondary | ICD-10-CM | POA: Diagnosis not present

## 2020-01-28 DIAGNOSIS — L570 Actinic keratosis: Secondary | ICD-10-CM | POA: Diagnosis not present

## 2020-01-28 DIAGNOSIS — L821 Other seborrheic keratosis: Secondary | ICD-10-CM | POA: Diagnosis not present

## 2020-02-21 DIAGNOSIS — I10 Essential (primary) hypertension: Secondary | ICD-10-CM | POA: Diagnosis not present

## 2020-02-21 DIAGNOSIS — G3184 Mild cognitive impairment, so stated: Secondary | ICD-10-CM | POA: Diagnosis not present

## 2020-02-21 DIAGNOSIS — Z79899 Other long term (current) drug therapy: Secondary | ICD-10-CM | POA: Diagnosis not present

## 2020-02-21 DIAGNOSIS — Z1389 Encounter for screening for other disorder: Secondary | ICD-10-CM | POA: Diagnosis not present

## 2020-02-21 DIAGNOSIS — Z0000 Encounter for general adult medical examination without abnormal findings: Secondary | ICD-10-CM | POA: Diagnosis not present

## 2020-03-30 DIAGNOSIS — L239 Allergic contact dermatitis, unspecified cause: Secondary | ICD-10-CM | POA: Diagnosis not present

## 2020-03-30 DIAGNOSIS — L308 Other specified dermatitis: Secondary | ICD-10-CM | POA: Diagnosis not present

## 2020-08-26 DIAGNOSIS — I10 Essential (primary) hypertension: Secondary | ICD-10-CM | POA: Diagnosis not present

## 2020-08-26 DIAGNOSIS — G3184 Mild cognitive impairment, so stated: Secondary | ICD-10-CM | POA: Diagnosis not present

## 2020-08-31 ENCOUNTER — Encounter: Payer: Self-pay | Admitting: Neurology

## 2020-11-28 DIAGNOSIS — I679 Cerebrovascular disease, unspecified: Secondary | ICD-10-CM | POA: Insufficient documentation

## 2020-11-28 DIAGNOSIS — I6381 Other cerebral infarction due to occlusion or stenosis of small artery: Secondary | ICD-10-CM | POA: Insufficient documentation

## 2020-11-28 HISTORY — DX: Other cerebral infarction due to occlusion or stenosis of small artery: I63.81

## 2020-11-28 HISTORY — DX: Cerebrovascular disease, unspecified: I67.9

## 2020-12-07 ENCOUNTER — Other Ambulatory Visit (INDEPENDENT_AMBULATORY_CARE_PROVIDER_SITE_OTHER): Payer: Medicare Other

## 2020-12-07 ENCOUNTER — Other Ambulatory Visit: Payer: Self-pay

## 2020-12-07 ENCOUNTER — Ambulatory Visit (INDEPENDENT_AMBULATORY_CARE_PROVIDER_SITE_OTHER): Payer: Medicare Other | Admitting: Neurology

## 2020-12-07 ENCOUNTER — Encounter: Payer: Self-pay | Admitting: Neurology

## 2020-12-07 VITALS — BP 165/83 | HR 56 | Ht 70.0 in | Wt 171.8 lb

## 2020-12-07 DIAGNOSIS — G3184 Mild cognitive impairment, so stated: Secondary | ICD-10-CM

## 2020-12-07 DIAGNOSIS — R413 Other amnesia: Secondary | ICD-10-CM | POA: Diagnosis not present

## 2020-12-07 LAB — VITAMIN B12: Vitamin B-12: 304 pg/mL (ref 211–911)

## 2020-12-07 LAB — TSH: TSH: 1.55 u[IU]/mL (ref 0.35–4.50)

## 2020-12-07 NOTE — Progress Notes (Signed)
NEUROLOGY CONSULTATION NOTE  Victor Mcbride MRN: [id number] DOB: December 17, 1940  Referring provider: Dr. Lajean Mcbride Primary care provider: Dr. Lajean Mcbride  Reason for consult:  Memory loss  Dear Dr Victor Mcbride:  Thank you for your kind referral of Victor Mcbride for consultation of the above symptoms. Although his history is well known to you, please allow me to reiterate it for the purpose of our medical record. The patient was accompanied to the clinic by his wife Victor Mcbride who also provides collateral information. Records and images were personally reviewed where available.   HISTORY OF PRESENT ILLNESS: This is an 80 year old right-handed man with a history of hypertension, hyperlipidemia, presenting for evaluation of memory loss. He started reporting concerns in 2015, MMSE 30/30 at PCP office. Repeat MMSE in 01/2020 at Dr. Carlyle Mcbride office was 27/30. He and his wife wanted to start medication and he has been taking Donepezil 10mg  daily for the past 4-5 years without side effects.  He states he is having issues with his memory, he cannot remember where he put things. His wife started noticing changes with his short-term memory 7-8 years ago, he would repeat the same question and forget details of her answer. There has been a fairly slow progression but changes have been more pronounced over the past year or so. He continues to drive without getting lost, his wife denies any driving concerns. He manages his own medications without difficulties. His wife manages finances, he had been doing the bigger investments and taxes and can still do it. He keeps repeating that he has become more dependent on her to do these things. No word-finding difficulties. He is independent with dressing and bathing, no hygiene concerns. He has noticed more difficulties reading, he has a hard time keeping track of characters. He used to work in Engineer, mining and denies any issues using the computer. He states mood is  generally good, his wife notes he gets more frustrated now when he cannot remember something. No paranoia or hallucinations.  He denies any headaches, dizziness, diplopia, dysarthria, dysphagia, neck/back pain, focal numbness/tingling/weakness, bowel/bladder dysfunction, anosmia, no falls. He has had hand tremors for many years. He sleeps fine, but sometimes wakes up a few times and thinks about something. He walks it off and goes back to sleep. He dozes off in the day sometimes. His wife notes snoring, no REM behavior disorder. His maternal grandmother had dementia in her 13s. He denies any significant head injuries. He and his wife split a bottle of wine every night, he drinks 1.5-2 oz of scotch sometimes. They moved to [place] 3 years ago.    PAST MEDICAL HISTORY: Past Medical History:  Diagnosis Date  . Hypertension     PAST SURGICAL HISTORY: No past surgical history on file.  MEDICATIONS: Current Outpatient Medications on File Prior to Visit  Medication Sig Dispense Refill  . amLODIPine Besylate-Celecoxib 2.5-200 MG TABS Take by mouth.    Marland Kitchen aspirin 81 MG tablet Take 81 mg by mouth daily.    Marland Kitchen atenolol (TENORMIN) 25 MG tablet Take 25 mg by mouth daily.    . cholecalciferol (VITAMIN D3) 25 MCG (1000 UNIT) tablet Take 1,000 Units by mouth daily.    Marland Kitchen donepezil (ARICEPT) 10 MG tablet Take 10 mg by mouth daily.    Marland Kitchen losartan (COZAAR) 50 MG tablet Take 50 mg by mouth daily.    . Multiple Vitamin (MULTIVITAMIN) tablet Take 1 tablet by mouth daily.    . Omega-3 Fatty  Acids (FISH OIL) 1000 MG CAPS Take 1,000 mg by mouth daily.    . simvastatin (ZOCOR) 10 MG tablet Take 10 mg by mouth daily.    . hydrochlorothiazide (MICROZIDE) 12.5 MG capsule Take 12.5 mg by mouth daily. (Patient not taking: Reported on 12/07/2020)    . loratadine (CLARITIN) 10 MG tablet Take 10 mg by mouth daily. (Patient not taking: Reported on 12/07/2020)    . omeprazole (PRILOSEC) 20 MG capsule Take 20 mg by mouth 2  (two) times a week. Pt takes every Monday and Friday (Patient not taking: Reported on 12/07/2020)     No current facility-administered medications on file prior to visit.    ALLERGIES: No Known Allergies  FAMILY HISTORY: No family history on file.  SOCIAL HISTORY: Social History   Socioeconomic History  . Marital status: Married    Spouse name: Not on file  . Number of children: Not on file  . Years of education: Not on file  . Highest education level: Not on file  Occupational History  . Not on file  Tobacco Use  . Smoking status: Never Smoker  . Smokeless tobacco: Not on file  Substance and Sexual Activity  . Alcohol use: Yes  . Drug use: No  . Sexual activity: Not on file  Other Topics Concern  . Not on file  Social History Narrative  . Not on file   Social Determinants of Health   Financial Resource Strain: Not on file  Food Insecurity: Not on file  Transportation Needs: Not on file  Physical Activity: Not on file  Stress: Not on file  Social Connections: Not on file  Intimate Partner Violence: Not on file     PHYSICAL EXAM: Vitals:   12/07/20 1019  BP: (!) 165/83  Pulse: (!) 56  SpO2: 97%   General: No acute distress Head:  Normocephalic/atraumatic Skin/Extremities: No rash, no edema Neurological Exam: Mental status: alert and oriented to person, place, and time, no dysarthria or aphasia, Fund of knowledge is appropriate.  Recent and remote memory are impaired.  Attention and concentration are normal.  Able to name objects and repeat phrases. MOCA score 20/30. Montreal Cognitive Assessment  12/07/2020  Visuospatial/ Executive (0/5) 3  Naming (0/3) 3  Attention: Read list of digits (0/2) 2  Attention: Read list of letters (0/1) 1  Attention: Serial 7 subtraction starting at 100 (0/3) 3  Language: Repeat phrase (0/2) 2  Language : Fluency (0/1) 0  Abstraction (0/2) 2  Delayed Recall (0/5) 0  Orientation (0/6) 4  Total 20    Cranial nerves: CN  I: not tested CN II: pupils equal, round and reactive to light, visual fields intact CN III, IV, VI:  full range of motion, no nystagmus, no ptosis CN V: facial sensation intact CN VII: upper and lower face symmetric CN VIII: hearing intact to conversation CN IX, X: gag intact, uvula midline CN XI: sternocleidomastoid and trapezius muscles intact CN XII: tongue midline Bulk & Tone: normal, no fasciculations, no cogwheeling Motor: 5/5 throughout with no pronator drift. Sensation: intact to light touch, cold, pin, vibration sense.  No extinction to double simultaneous stimulation.  Romberg test negative Deep Tendon Reflexes: +2 throughout Cerebellar: no incoordination on finger to nose testing Gait: narrow-based and steady, difficulty with tandem walk Tremor: he has high frequency low amplitude bilateral hand postural and endpoint tremors, no resting tremor. Good finger taps   IMPRESSION: This is an 80 year old right-handed man with a history of hypertension,  hyperlipidemia, presenting for evaluation of memory loss. His neurological exam is non-focal, MOCA score today 20/30. He and his wife deny any difficulties with complex tasks. 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 We discussed different causes of memory loss. Check TSH and B12. MRI brain without contrast will be ordered to assess for underlying structural abnormality and assess vascular load. He will be scheduled for Neurocognitive testing to further evaluate cognitive concerns. Continue Donepezil 10mg  daily. We discussed essential tremor, no parkinsonian signs. We discussed the importance of control of vascular risk factors, physical exercise, and brain stimulation exercises for brain health. Follow-up in 6-8 months, they know to call for any changes.    Thank you for allowing me to participate in the care of this patient. Please do not hesitate to call for any questions or concerns.   Ellouise Newer, M.D.  CC: Dr. Felipa Mcbride

## 2020-12-07 NOTE — Patient Instructions (Addendum)
1. Bloodwork for TSH, B12 Your provider has requested that you have labwork completed today. Please go to [facility] ([id number]) on the second floor of this building before leaving the office today. You do not need to check in. If you are not called within 15 minutes please check with the front desk.   2. Schedule MRI brain without contrast We have sent a referral to [facility] for your MRI and they will call you directly to schedule your appointment. They are located at [place]. If you need to contact them directly please call [phone].  3. Schedule Neurocognitive testing  4. If interested, there are some activities which have therapeutic value and can be useful in keeping you cognitively stimulated. You can try this website: https://www.[facility].org/get-to-know-barrow/centers-programs/neurorehabilitation-center/neuro-rehab-apps-and-games/ which has options, categorized by level of difficulty.  5. Follow-up in 6-8 months, call for any changes   You have been referred for a neuropsychological evaluation (i.e., evaluation of memory and thinking abilities). Please bring someone with you to this appointment if possible, as it is helpful for the doctor to hear from both you and another adult who knows you well. Please bring eyeglasses and hearing aids if you wear them.    The evaluation will take approximately 3 hours and has two parts:   . The first part is a clinical interview with the neuropsychologist (Dr. Melvyn Novas or Dr. Nicole Kindred). During the interview, the neuropsychologist will speak with you and the individual you brought to the appointment.    . The second part of the evaluation is testing with the doctor's technician Hinton Dyer or Maudie Mercury). During the testing, the technician will ask you to remember different types of material, solve problems, and answer some questionnaires. Your family member will not be present for this portion of the evaluation.   Please note:  We must reserve several hours of the neuropsychologist's time and the psychometrician's time for your evaluation appointment. As such, there is a No-Show fee of $100. If you are unable to attend any of your appointments, please contact our office as soon as possible to reschedule.    RECOMMENDATIONS FOR ALL PATIENTS WITH MEMORY PROBLEMS: 1. Continue to exercise (Recommend 30 minutes of walking everyday, or 3 hours every week) 2. Increase social interactions - continue going to [place] and enjoy social gatherings with friends and family 3. Eat healthy, avoid fried foods and eat more fruits and vegetables 4. Maintain adequate blood pressure, blood sugar, and blood cholesterol level. Reducing the risk of stroke and cardiovascular disease also helps promoting better memory. 5. Avoid stressful situations. Live a simple life and avoid aggravations. Organize your time and prepare for the next day in anticipation. 6. Sleep well, avoid any interruptions of sleep and avoid any distractions in the bedroom that may interfere with adequate sleep quality 7. Avoid sugar, avoid sweets as there is a strong link between excessive sugar intake, diabetes, and cognitive impairment We discussed the Mediterranean diet, which has been shown to help patients reduce the risk of progressive memory disorders and reduces cardiovascular risk. This includes eating fish, eat fruits and green leafy vegetables, nuts like almonds and hazelnuts, walnuts, and also use olive oil. Avoid fast foods and fried foods as much as possible. Avoid sweets and sugar as sugar use has been linked to worsening of memory function.

## 2020-12-22 ENCOUNTER — Other Ambulatory Visit: Payer: Self-pay

## 2020-12-22 ENCOUNTER — Encounter: Payer: Self-pay | Admitting: Psychology

## 2020-12-22 ENCOUNTER — Ambulatory Visit: Payer: Medicare Other

## 2020-12-22 ENCOUNTER — Ambulatory Visit (INDEPENDENT_AMBULATORY_CARE_PROVIDER_SITE_OTHER): Payer: Medicare Other | Admitting: Psychology

## 2020-12-22 DIAGNOSIS — G3184 Mild cognitive impairment, so stated: Secondary | ICD-10-CM | POA: Diagnosis not present

## 2020-12-22 DIAGNOSIS — I10 Essential (primary) hypertension: Secondary | ICD-10-CM | POA: Insufficient documentation

## 2020-12-22 DIAGNOSIS — G309 Alzheimer's disease, unspecified: Secondary | ICD-10-CM

## 2020-12-22 DIAGNOSIS — F0670 Mild neurocognitive disorder due to known physiological condition without behavioral disturbance: Secondary | ICD-10-CM

## 2020-12-22 DIAGNOSIS — R4189 Other symptoms and signs involving cognitive functions and awareness: Secondary | ICD-10-CM

## 2020-12-22 DIAGNOSIS — E785 Hyperlipidemia, unspecified: Secondary | ICD-10-CM | POA: Insufficient documentation

## 2020-12-22 HISTORY — DX: Mild neurocognitive disorder due to known physiological condition without behavioral disturbance: F06.70

## 2020-12-22 HISTORY — DX: Mild cognitive impairment of uncertain or unknown etiology: G31.84

## 2020-12-22 HISTORY — DX: Alzheimer's disease, unspecified: G30.9

## 2020-12-22 NOTE — Progress Notes (Addendum)
NEUROPSYCHOLOGICAL EVALUATION [place]. [facility] Department of Neurology  Date of Evaluation: December 22, 2020  Reason for Referral:   Victor Mcbride is a 81 y.o. right-handed Caucasian male referred by Victor Mcbride, M.D., to characterize his current cognitive functioning and assist with diagnostic clarity and treatment planning in the context of subjective cognitive decline.   Assessment and Plan:   Clinical Impression(s): Victor Mcbride pattern of performance is suggestive of significant impairment surrounding retrieval and consolidation aspects of memory. An additional impairment was exhibited across semantic fluency, as well as an isolated line orientation task. Performance variability was exhibited across complex attention and response inhibition. Performance was appropriate across processing speed, basic attention, cognitive flexibility, verbal reasoning, safety/judgment, receptive language, phonemic fluency, confrontation naming, and visuoconstructional abilities. Victor Mcbride denied difficulties completing instrumental activities of daily living (ADLs) independently. 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  Specific to memory, Victor Mcbride was largely amnestic across delayed memory trials and performed far below expectation across yes/no recognition trials. 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 In addition, his wife's report of the gradual decline of cognitive abilities over the course of several years is very consistent with this condition. While a noted weakness in semantic fluency relative to phonemic fluency is further consistent with this presentation, he did perform well across confrontation naming, executive functioning, and visuoconstructional tasks which  suggests that this disease process remains in earlier stages if indeed present. Additionally, given neuroimaging suggesting moderate small vessel ischemic changes and the presence of several chronic lacunar infarcts, Victor Mcbride is at an increased risk for a "mixed" dementia" presentation (i.e., Alzheimer's disease made worse by the presence of cerebrovascular illness). Cognitive and behavioral characteristics are not consistent with other neurodegenerative illnesses such as Lewy body dementia or frontotemporal dementia. Continued medical monitoring will be important moving forward.  Recommendations: A repeat neuropsychological evaluation in 18-24 months (or sooner if functional decline is noted) is recommended to assess the trajectory of future cognitive decline should it occur. This will also aid in future efforts towards improved diagnostic clarity.  Victor Mcbride is encouraged to continue taking donepezil/Aricept as prescribed by his neurologist as this medication has been shown to slow functional decline in some individuals. It is important to highlight that no current treatment is able to stop or reverse cognitive decline in the face of a neurodegenerative illness.   Should there be a progression of his current deficits over time, Victor Mcbride is unlikely to regain any independent living skills lost. Therefore, it is recommended that he remain as involved as possible in all aspects of household chores, finances, and medication management, with supervision to ensure adequate performance. He will likely benefit from the establishment and maintenance of a routine in order to maximize his functional abilities over time.  It will be important for Victor Mcbride to have another person with him when in situations where he may need to process information, weigh the pros and cons of different options, and make decisions, in order to ensure that he fully understands and recalls all information to be considered.  If not  already done, Victor Mcbride and his family may want to discuss his wishes regarding durable power of attorney and medical decision making, so that he can have input into these choices. Additionally, they may wish to discuss future plans for  caretaking and seek out community options for in home/residential care should they become necessary.  Performance across neurocognitive testing is not a strong predictor of an individual's safety operating a motor vehicle. Should his family wish to pursue a formalized driving evaluation, they would be encouraged to contact [employer] in [place], [place] at [phone]. Another option would be through [facility]; however, the latter would likely require a referral from a medical doctor. [facility] can be reached directly at [phone].   Information important to remember should be provided in written format in all instances. This should be placed in a highly visible and commonly frequented location in his residence to try to promote recall.   To address problems with fluctuating attention, he may wish to consider:   -Avoiding external distractions when needing to concentrate   -Limiting exposure to fast paced environments with multiple sensory demands   -Writing down complicated information and using checklists   -Attempting and completing one task at a time (i.e., no multi-tasking)   -Verbalizing aloud each step of a task to maintain focus   -Taking frequent breaks during the completion of steps/tasks to avoid fatigue   -Reducing the amount of information considered at one time  Review of Records:   Victor Mcbride was seen by [facility] Marland KitchenEllouise Mcbride, M.D.) on 12/07/2020 for an evaluation of memory loss. He reported trouble with misplacing objects. His wife started noticing changes with short-term memory 7-8 years ago where he would repeat the same question and forget details of her answer. There has been a fairly slow progression but  changes have been more pronounced over the past year or so. He continues to drive without getting lost and his wife denied any driving concerns. He manages his own medications without difficulties. His wife manages finances; however he had been doing the bigger investments and taxes without noted issue. He did repeat that he has become more dependent on her to perform these actions. He described his mood as generally good. His wife noted that he gets more frustrated when he cannot remember something. He denied ongoing paranoia, hallucinations, headaches, dizziness, diplopia, dysarthria, dysphagia, neck/back pain, focal numbness/tingling/weakness, bowel/bladder dysfunction, anosmia, or recent falls. He has had hand tremors for many years. He sleeps fine but will sometimes wake a few times throughout the night. He generally walks it off and goes back to sleep. No REM sleep behaviors were noted. He and his wife split a bottle of wine every night; he also sometimes drinks 1.5-2 oz of scotch in addition to this. Per records, memory concerns were first noted in 2015. Performance on a brief cognitive screening instrument (MMSE) was 30/30 at that time. Repeat MMSE in March 2021 was 27/30. Performance on a different screening instrument [facility]) with Dr. Delice Lesch during the current appointment was 20/30. Ultimately, Victor Mcbride was referred for a comprehensive neuropsychological evaluation to characterize his cognitive abilities and to assist with diagnostic clarity and treatment planning.   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  Past Medical History:  Diagnosis Date  . Hyperlipidemia   . Hypertension     No past surgical history on file.   Current Outpatient Medications:  .  amLODIPine Besylate-Celecoxib 2.5-200 MG TABS, Take by mouth., Disp: , Rfl:  .   aspirin 81 MG tablet, Take 81 mg by mouth daily., Disp: , Rfl:  .  atenolol (TENORMIN) 25 MG tablet, Take 25 mg by mouth daily., Disp: , Rfl:  .  cholecalciferol (VITAMIN D3) 25 MCG (1000 UNIT) tablet, Take 1,000 Units by mouth daily., Disp: , Rfl:  .  donepezil (ARICEPT) 10 MG tablet, Take 10 mg by mouth daily., Disp: , Rfl:  .  hydrochlorothiazide (MICROZIDE) 12.5 MG capsule, Take 12.5 mg by mouth daily. (Patient not taking: Reported on 12/07/2020), Disp: , Rfl:  .  loratadine (CLARITIN) 10 MG tablet, Take 10 mg by mouth daily. (Patient not taking: Reported on 12/07/2020), Disp: , Rfl:  .  losartan (COZAAR) 50 MG tablet, Take 50 mg by mouth daily., Disp: , Rfl:  .  Multiple Vitamin (MULTIVITAMIN) tablet, Take 1 tablet by mouth daily., Disp: , Rfl:  .  Omega-3 Fatty Acids (FISH OIL) 1000 MG CAPS, Take 1,000 mg by mouth daily., Disp: , Rfl:  .  omeprazole (PRILOSEC) 20 MG capsule, Take 20 mg by mouth 2 (two) times a week. Pt takes every Monday and Friday (Patient not taking: Reported on 12/07/2020), Disp: , Rfl:  .  simvastatin (ZOCOR) 10 MG tablet, Take 10 mg by mouth daily., Disp: , Rfl:   Clinical Interview:   The following information was obtained during a clinical interview with Victor Mcbride and his wife prior to cognitive testing.  Cognitive Symptoms: Decreased short-term memory: Endorsed. He reported trouble misplacing things around his residence. His wife noted him having trouble recalling the details of conversations and occasional trouble recalling the names of familiar individuals. Per his wife, deficits were said to have started around 2015 and have gradually worsened over time.  Decreased long-term memory: Denied. Decreased attention/concentration: Endorsed. He frequently repeated the sentiment that he does not pay attention to things that do not interest him. This repetition was noted when asked both about attention/concentration issues, as well as ongoing memory dysfunction. He was  seemingly unable to answer these questions in a broader sense.  Reduced processing speed: Denied. Difficulties with executive functions: Denied. Trouble with impulsivity and the presence of overt personality changes were also denied.  Difficulties with emotion regulation: Denied. Difficulties with receptive language: Denied. Difficulties with word finding: Denied. Decreased visuoperceptual ability: Denied.  Difficulties completing ADLs: Denied. His wife manages personal finances but this is longstanding in nature. He also repeatedly described his wife's strong organizational abilities, stating that he greatly benefits from them.   Additional Medical History: History of traumatic brain injury/concussion: Denied. History of stroke: Denied. History of seizure activity: Unclear. When asked this question, his wife answered in the affirmative. However, she then described what appeared to be syncopal episodes later found to be due to dehydration. It is unclear if Victor Mcbride ever experienced true seizure activity.  History of known exposure to toxins: Denied. Symptoms of chronic pain: Denied. Experience of frequent headaches/migraines: Denied. Frequent instances of dizziness/vertigo: Denied.  Sensory changes: He wears glasses with positive effect. Other sensory changes/difficulties (e.g., hearing, taste, and smell) were denied.  Balance/coordination difficulties: Denied. Outside of a recent instance where he slipped and fell on a sheet of ice, no balance concerns or recent falls were reported.  Other motor difficulties: Endorsed. He reported occasional instances of mild upper extremity tremors, largely when performing fine motor activities (e.g., picking up a spoon).   Sleep History: Estimated hours obtained each night: 7-8 hours.  Difficulties falling asleep: Denied. Difficulties staying asleep: Endorsed. He reported waking throughout the night to use the restroom. Occasionally, he reported that  thoughts will enter his mind which  may prevent him from falling back asleep. He noted walking around his residence while sorting through the problem in his mind. After this is complete, he is largely able to go back asleep reasonably quickly.  Feels rested and refreshed upon awakening: Endorsed. However, he also remarked feeling as though he needs to get more sleep throughout the night.   History of snoring: Endorsed. However, his wife said that this has become far less pronounced over recent years. He reported that these behaviors were due to low humidity levels in their residence and that he must wake up and use a saline solution in his nose. His wife stated that he will experience frequent nosebleeds.  History of waking up gasping for air: Denied. Witnessed breath cessation while asleep: Denied.  History of vivid dreaming: Denied. Excessive movement while asleep: Denied. Instances of acting out his dreams: Denied.  Psychiatric/Behavioral Health History: Depression: He described his current mood as "good." His wife noted increased irritability and frustration, especially when he is faced with memory dysfunction in his day-to-day life. He denied to his knowledge being formally diagnosed with a mental health condition in the past. Current or remote suicidal ideation, intent, or plan was denied.  Anxiety: Denied. Mania: Denied. Trauma History: Denied. Visual/auditory hallucinations: Denied. Delusional thoughts: Denied.  Tobacco: Denied. Alcohol: He reported that he and his wife will commonly split a bottle of wine each night. On days where they have company over, he will commonly consume 1-2 ounces of scotch in addition to this. He denied a history of problematic alcohol abuse or dependence.  Recreational drugs: Denied. Caffeine: He reported consuming one cup of coffee in the morning. His wife stated that he will consume diet cola beverages throughout the day.   Family History: Problem  Relation Age of Onset  . Dementia Maternal Grandmother    This information was confirmed by Victor Mcbride.  Academic/Vocational History: Highest level of educational attainment: 18 years. He reported earning his MBA. He described himself as an average (B/C) student in academic settings. No relative weaknesses were identified.  History of developmental delay: Denied. History of grade repetition: Denied. Enrollment in special education courses: Denied. History of LD/ADHD: Denied.  Employment: Retired. 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   Evaluation Results:   Behavioral Observations: Victor Mcbride was accompanied by his wife, arrived to his appointment on time, and was appropriately dressed and groomed. He appeared alert and oriented. Observed gait and station were within normal limits outside of being slowed. Gross motor functioning appeared intact upon informal observation and no abnormal movements (e.g., tremors) were noted during interview. His affect was generally relaxed and positive, but did range appropriately given the subject being discussed during the clinical interview or the task at hand during testing procedures. Spontaneous speech was fluent and word finding difficulties were not observed during the clinical interview. Thought processes were coherent, organized, and normal in content. Insight into his cognitive difficulties appeared somewhat limited in that he may not appreciate the extent of ongoing memory dysfunction. During testing he was tangential and often attempted to tell stories to the psychometrist. These same stories were frequently repeated throughout testing. Task engagement was adequate and he persisted when challenged. Tremors were observed across visuoconstructional tasks which required a fine motor component (e.g., Figure Copy). These were scored with some leniency in response. Overall, Mr. Fayette  was cooperative with the clinical interview and subsequent testing procedures.   Adequacy  of Effort: The validity of neuropsychological testing is limited by the extent to which the individual being tested may be assumed to have exerted adequate effort during testing. Victor Mcbride expressed his intention to perform to the best of his abilities and exhibited adequate task engagement and persistence. Scores across stand-alone and embedded performance validity measures were within expectation. As such, the results of the current evaluation are believed to be a valid representation of Victor Mcbride current cognitive functioning.  Test Results: Victor Mcbride was mildly disoriented to time during the current evaluation. He incorrectly stated the current year ("2021"), month ("February"), and time.  Intellectual abilities based upon educational and vocational attainment were estimated to be in the average to above average range. Premorbid abilities were estimated to be within the above average range based upon a single-word reading test.   Processing speed was average to above average. Basic attention was above average to well above average. More complex attention (e.g., working memory) was well below average to average. Executive functioning was largely below average to average. However, he did have varying difficulty across a task assessing response inhibition.  Assessed receptive language abilities were above average. Likewise, Mr. Melito did not exhibit any difficulties comprehending task instructions and answered all questions asked of him appropriately. Assessed expressive language was variable. Phonemic fluency was average, semantic fluency was well below average, and confrontation naming was above average to well above average.     Assessed visuospatial/visuoconstructional abilities were largely average to above average. However, he did perform in the well below average range across a line orientation task.  Points were lost on his drawing of a clock due to poor hand representation. Points were lost on his copy of a complex figure due to mild visual distortions and spatial abnormalities.    Learning (i.e., encoding) of novel verbal information was exceptionally low to average. Spontaneous delayed recall (i.e., retrieval) of previously learned information was exceptionally low to well below average. Retention rates were 40% (raw score of two) across a story learning task, 0% across a list learning task, and 0% across a complex figure drawing task. Performance across recognition tasks was largely exceptionally low to well below average, suggesting limited evidence for information consolidation.   Results of emotional screening instruments suggested that recent symptoms of generalized anxiety were in the minimal range, while symptoms of depression were within normal limits. A screening instrument assessing recent sleep quality suggested the presence of minimal sleep dysfunction.  Tables of Scores:   Note: This summary of test scores accompanies the interpretive report and should not be considered in isolation without reference to the appropriate sections in the text. Descriptors are based on appropriate normative data and may be adjusted based on clinical judgment. The terms "impaired" and "within normal limits (WNL)" are used when a more specific level of functioning cannot be determined.       Effort Testing:   DESCRIPTOR       Dot Counting Test: --- --- Within Expectation  RBANS Effort Index: --- --- Within Expectation  WAIS-IV Reliable Digit Span: --- --- Within Expectation  D-KEFS Color Word Effort Index: --- --- Within Expectation       Orientation:      Raw Score Percentile   NAB Orientation, Form 1 26/29 --- ---       Cognitive Screening:           Raw Score Percentile   SLUMS: 17/30 --- ---       RBANS, Form A:  Standard Score/ Scaled Score Percentile   Total Score 76 5 Well Below  Average  Immediate Memory 76 5 Well Below Average    List Learning 3 1 Exceptionally Low    Story Memory 8 25 Average  Visuospatial/Constructional 78 7 Well Below Average    Figure Copy 8 25 Average    Line Orientation 11/20 3-9 Well Below Average  Language 86 18 Below Average    Picture Naming 10/10 >75 Above Average    Semantic Fluency 4 2 Well Below Average  Attention 115 84 Above Average    Digit Span 15 95 Well Above Average    Coding 10 50 Average  Delayed Memory 48 <1 Exceptionally Low    List Recall 0/10 <2 Exceptionally Low    List Recognition 13/20 <2 Exceptionally Low    Story Recall 4 2 Well Below Average    Story Recognition 8/12 14-28 Below Average    Figure Recall 1 <1 Exceptionally Low    Figure Recognition 2/8 1-5 Well Below Average       Intellectual Functioning:           Standard Score Percentile   Test of Premorbid Functioning: 115 84 Above Average       Attention/Executive Function:          Trail Making Test (TMT): Raw Score (T Score) Percentile     Part A 34 secs.,  0 errors (49) 46 Average    Part B 139 secs.,  3 errors (41) 18 Below Average         Scaled Score Percentile   WAIS-IV Digit Span: 9 37 Average    Forward 12 75 Above Average    Backward 11 63 Average    Sequencing 5 5 Well Below Average        Scaled Score Percentile   WAIS-IV Similarities: 9 37 Average       D-KEFS Color-Word Interference Test: Raw Score (Scaled Score) Percentile     Color Naming 29 secs. (13) 84 Above Average    Word Reading 21 secs. (13) 84 Above Average    Inhibition 67 secs. (13) 84 Above Average      Total Errors 3 errors (10) 50 Average    Inhibition/Switching 47 secs. (16) 98 Exceptionally High      Total Errors 24 errors (1) <1 Exceptionally Low       NAB Executive Functions Module, Form 1: T Score Percentile     Judgment 54 66 Average       Language:          Verbal Fluency Test: Raw Score (Scaled Score) Percentile     Phonemic Fluency (CFL) 28  (9) 37 Average    Category Fluency 21 (5) 5 Well Below Average  *Based on [employer] Older Normative Studies (MOANS)          NAB Language Module, Form 1: T Score Percentile     Auditory Comprehension 57 75 Above Average    Naming 31/31 (63) 91 Well Above Average       Visuospatial/Visuoconstruction:      Raw Score Percentile   Clock Drawing: 9/10 --- Within Normal Limits        Scaled Score Percentile   WAIS-IV Block Design: 13 84 Above Average       Mood and Personality:      Raw Score Percentile   Geriatric Depression Scale: 0 --- Within Normal Limits  Geriatric Anxiety Scale: 3 --- Minimal    Somatic  2 --- Minimal    Cognitive 0 --- Minimal    Affective 1 --- Minimal       Additional Questionnaires:      Raw Score Percentile   PROMIS Sleep Disturbance Questionnaire: 14 --- None to Slight   Informed Consent and Coding/Compliance:   Mr. Doerflinger was provided with a verbal description of the nature and purpose of the present neuropsychological evaluation. Also reviewed were the foreseeable risks and/or discomforts and benefits of the procedure, limits of confidentiality, and mandatory reporting requirements of this provider. The patient was given the opportunity to ask questions and receive answers about the evaluation. Oral consent to participate was provided by the patient.   This evaluation was conducted by Christia Reading, Ph.D., licensed clinical neuropsychologist. Mr. Muhs completed a comprehensive clinical interview with Dr. Melvyn Novas, billed as one unit [phone], and 140 minutes of cognitive testing and scoring, billed as one unit [phone] and four additional units [id number]. Psychometrist Cruzita Lederer, B.S., assisted Dr. Melvyn Novas with test administration and scoring procedures. As a separate and discrete service, Dr. Melvyn Novas spent a total of 160 minutes in interpretation and report writing billed as one unit [phone] and two units [id number].

## 2020-12-22 NOTE — Progress Notes (Signed)
   Psychometrician Note   Cognitive testing was administered to Victor Mcbride by Cruzita Lederer, B.S. (psychometrist) under the supervision of Dr. Christia Reading, Ph.D., licensed psychologist on 12/22/2020. Mr. Paulsen did not appear overtly distressed by the testing session per behavioral observation or responses across self-report questionnaires. Dr. Christia Reading, Ph.D. checked in with Mr. Brumbaugh as needed to manage any distress related to testing procedures (if applicable). Rest breaks were offered.    The battery of tests administered was selected by Dr. Christia Reading, Ph.D. with consideration to Mr. Gary current level of functioning, the nature of his symptoms, emotional and behavioral responses during interview, level of literacy, observed level of motivation/effort, and the nature of the referral question. This battery was communicated to the psychometrist. Communication between Dr. Christia Reading, Ph.D. and the psychometrist was ongoing throughout the evaluation and Dr. Christia Reading, Ph.D. was immediately accessible at all times. Dr. Christia Reading, Ph.D. provided supervision to the psychometrist on the date of this service to the extent necessary to assure the quality of all services provided.    Victor Mcbride will return within approximately 1-2 weeks for an interactive feedback session with Dr. Melvyn Novas at which time his test performances, clinical impressions, and treatment recommendations will be reviewed in detail. Mr. Willmore understands he can contact our office should he require our assistance before this time.  A total of 140 minutes of billable time were spent face-to-face with Mr. Nigh by the psychometrist. This includes both test administration and scoring time. Billing for these services is reflected in the clinical report generated by Dr. Christia Reading, Ph.D..  This note reflects time spent with the psychometrician and does not include test scores or any clinical  interpretations made by Dr. Melvyn Novas. The full report will follow in a separate note.

## 2020-12-23 ENCOUNTER — Encounter: Payer: Self-pay | Admitting: Psychology

## 2020-12-24 DIAGNOSIS — H04121 Dry eye syndrome of right lacrimal gland: Secondary | ICD-10-CM | POA: Diagnosis not present

## 2020-12-24 DIAGNOSIS — H52203 Unspecified astigmatism, bilateral: Secondary | ICD-10-CM | POA: Diagnosis not present

## 2020-12-24 DIAGNOSIS — H2513 Age-related nuclear cataract, bilateral: Secondary | ICD-10-CM | POA: Diagnosis not present

## 2020-12-24 DIAGNOSIS — H25013 Cortical age-related cataract, bilateral: Secondary | ICD-10-CM | POA: Diagnosis not present

## 2020-12-25 ENCOUNTER — Other Ambulatory Visit: Payer: Self-pay

## 2020-12-25 ENCOUNTER — Ambulatory Visit
Admission: RE | Admit: 2020-12-25 | Discharge: 2020-12-25 | Disposition: A | Discharge status: Home / Self Care | Payer: Medicare Other | Source: Ambulatory Visit | Attending: Neurology | Admitting: Neurology

## 2020-12-25 DIAGNOSIS — I6782 Cerebral ischemia: Secondary | ICD-10-CM | POA: Diagnosis not present

## 2020-12-25 DIAGNOSIS — I6389 Other cerebral infarction: Secondary | ICD-10-CM | POA: Diagnosis not present

## 2020-12-25 DIAGNOSIS — R413 Other amnesia: Secondary | ICD-10-CM | POA: Diagnosis not present

## 2020-12-25 DIAGNOSIS — J3489 Other specified disorders of nose and nasal sinuses: Secondary | ICD-10-CM | POA: Diagnosis not present

## 2020-12-29 ENCOUNTER — Ambulatory Visit (INDEPENDENT_AMBULATORY_CARE_PROVIDER_SITE_OTHER): Payer: Medicare Other | Admitting: Psychology

## 2020-12-29 ENCOUNTER — Other Ambulatory Visit: Payer: Self-pay

## 2020-12-29 DIAGNOSIS — G3184 Mild cognitive impairment, so stated: Secondary | ICD-10-CM | POA: Diagnosis not present

## 2020-12-29 DIAGNOSIS — G309 Alzheimer's disease, unspecified: Secondary | ICD-10-CM

## 2020-12-29 NOTE — Patient Instructions (Signed)
A repeat neuropsychological evaluation in 18-24 months (or sooner if functional decline is noted) is recommended to assess the trajectory of future cognitive decline should it occur. This will also aid in future efforts towards improved diagnostic clarity.  Victor Mcbride is encouraged to continue taking donepezil/Aricept as prescribed by his neurologist as this medication has been shown to slow functional decline in some individuals. It is important to highlight that no current treatment is able to stop or reverse cognitive decline in the face of a neurodegenerative illness.   Should there be a progression of his current deficits over time, Victor Mcbride is unlikely to regain any independent living skills lost. Therefore, it is recommended that he remain as involved as possible in all aspects of household chores, finances, and medication management, with supervision to ensure adequate performance. He will likely benefit from the establishment and maintenance of a routine in order to maximize his functional abilities over time.  It will be important for Victor Mcbride to have another person with him when in situations where he may need to process information, weigh the pros and cons of different options, and make decisions, in order to ensure that he fully understands and recalls all information to be considered.  If not already done, Victor Mcbride and his family may want to discuss his wishes regarding durable power of attorney and medical decision making, so that he can have input into these choices. Additionally, they may wish to discuss future plans for caretaking and seek out community options for in home/residential care should they become necessary.  Performance across neurocognitive testing is not a strong predictor of an individual's safety operating a motor vehicle. Should his family wish to pursue a formalized driving evaluation, they would be encouraged to [employer] [facility] in [place],  [place] at [phone].Another option would be through [facility]; however, the latter would likely require a referral from a medical doctor. [facility] can be reached directly at [phone].  Information important to remember should be provided in written format in all instances. This should be placed in a highly visible and commonly frequented location in his residence to try to promote recall.   To address problems with fluctuating attention, he may wish to consider:   -Avoiding external distractions when needing to concentrate   -Limiting exposure to fast paced environments with multiple sensory demands   -Writing down complicated information and using checklists   -Attempting and completing one task at a time (i.e., no multi-tasking)   -Verbalizing aloud each step of a task to maintain focus   -Taking frequent breaks during the completion of steps/tasks to avoid fatigue   -Reducing the amount of information considered at one time

## 2020-12-29 NOTE — Progress Notes (Signed)
   Neuropsychology Feedback Session [name] Department of Neurology  Reason for Referral:   Victor Mcbride a 80 y.o. right-handed Caucasian male referred by Ellouise Newer, M.D.,to characterize hiscurrent cognitive functioning and assist with diagnostic clarity and treatment planning in the context of subjective cognitive decline.   Feedback:   Victor Mcbride completed a comprehensive neuropsychological evaluation on 12/22/2020. Please refer to that encounter for the full report and recommendations. Briefly, results suggested significant impairment surrounding retrieval and consolidation aspects of memory. An additional impairment was exhibited across semantic fluency, as well as an isolated line orientation task. Performance variability was exhibited across complex attention and response inhibition. Specific to memory, Victor Mcbride was largely amnestic across delayed memory trials and performed far below expectation across yes/no recognition trials. 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 In addition, his wife's report of the gradual decline of cognitive abilities over the course of several years is very consistent with this condition. While a noted weakness in semantic fluency relative to phonemic fluency is further consistent with this presentation, he did perform well across confrontation naming, executive functioning, and visuoconstructional tasks which suggests that this disease process remains in earlier stages if indeed present.  Victor Mcbride was accompanied by his wife during the current feedback session. Content of the current session focused on the results of his neuropsychological evaluation. Victor Mcbride and his wife were given the opportunity to ask questions and their questions were answered. They were encouraged to reach out should additional questions arise. A copy of his report was provided at  the conclusion of the visit.      45 minutes were spent conducting the current feedback session with Victor Mcbride, billed as one unit [phone].

## 2021-02-01 DIAGNOSIS — L570 Actinic keratosis: Secondary | ICD-10-CM | POA: Diagnosis not present

## 2021-02-01 DIAGNOSIS — L812 Freckles: Secondary | ICD-10-CM | POA: Diagnosis not present

## 2021-02-01 DIAGNOSIS — L821 Other seborrheic keratosis: Secondary | ICD-10-CM | POA: Diagnosis not present

## 2021-02-01 DIAGNOSIS — D1801 Hemangioma of skin and subcutaneous tissue: Secondary | ICD-10-CM | POA: Diagnosis not present

## 2021-02-01 DIAGNOSIS — D225 Melanocytic nevi of trunk: Secondary | ICD-10-CM | POA: Diagnosis not present

## 2021-02-01 DIAGNOSIS — M7130 Other bursal cyst, unspecified site: Secondary | ICD-10-CM | POA: Diagnosis not present

## 2021-02-24 DIAGNOSIS — E7800 Pure hypercholesterolemia, unspecified: Secondary | ICD-10-CM | POA: Diagnosis not present

## 2021-02-24 DIAGNOSIS — Z79899 Other long term (current) drug therapy: Secondary | ICD-10-CM | POA: Diagnosis not present

## 2021-02-24 DIAGNOSIS — Z0000 Encounter for general adult medical examination without abnormal findings: Secondary | ICD-10-CM | POA: Diagnosis not present

## 2021-02-24 DIAGNOSIS — Z1389 Encounter for screening for other disorder: Secondary | ICD-10-CM | POA: Diagnosis not present

## 2021-02-24 DIAGNOSIS — G301 Alzheimer's disease with late onset: Secondary | ICD-10-CM | POA: Diagnosis not present

## 2021-02-24 DIAGNOSIS — F0280 Dementia in other diseases classified elsewhere without behavioral disturbance: Secondary | ICD-10-CM | POA: Diagnosis not present

## 2021-02-24 DIAGNOSIS — I10 Essential (primary) hypertension: Secondary | ICD-10-CM | POA: Diagnosis not present

## 2021-02-24 DIAGNOSIS — M545 Low back pain, unspecified: Secondary | ICD-10-CM | POA: Diagnosis not present

## 2021-04-26 DIAGNOSIS — U071 COVID-19: Secondary | ICD-10-CM | POA: Diagnosis not present

## 2021-05-20 DIAGNOSIS — L245 Irritant contact dermatitis due to other chemical products: Secondary | ICD-10-CM | POA: Diagnosis not present

## 2021-08-15 IMAGING — MR MR HEAD W/O CM
10 series · 48 of 48 positions shown · non-contrast
Comparison: None.

CLINICAL DATA: Memory Loss

EXAM:
MRI HEAD WITHOUT CONTRAST
TECHNIQUE: Multiplanar, multiecho pulse sequences of the brain and surrounding
structures were obtained without intravenous contrast.

[Series 2: T1 · sagittal · 5.0mm · 0.45mm/px · 3 of 21 slices shown]
[im 1/21]
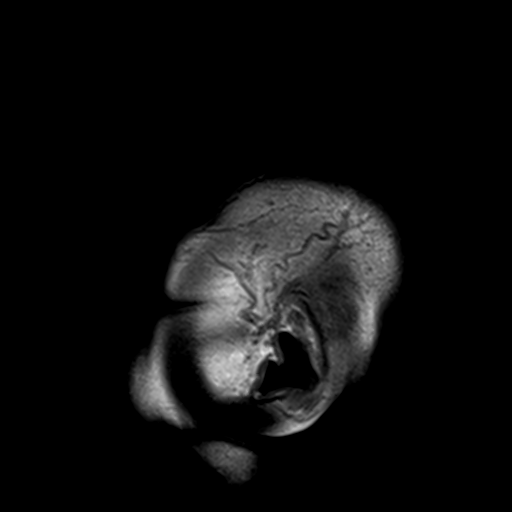
[im 11/21]
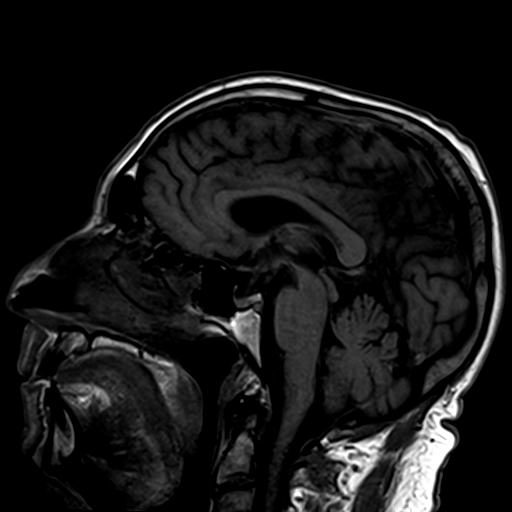
[im 21/21]
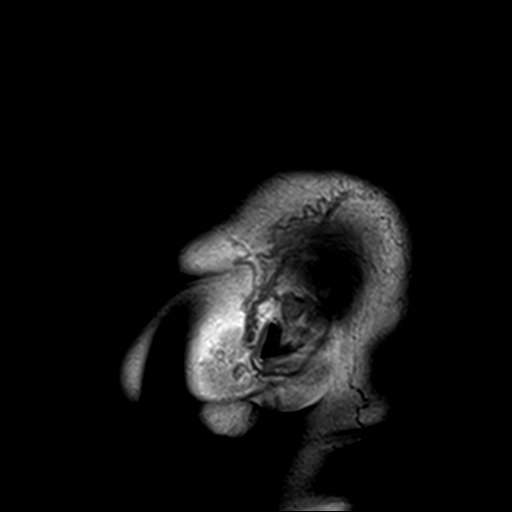

[Series 3: DWI · axial · 3.0mm · 1.80mm/px · z∈[-95,+52]mm · 9 of 100 slices shown (1 of 4)]
[im 1/100]
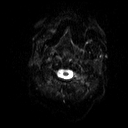
[im 13/100]
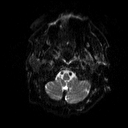
[im 25/100]
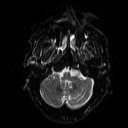
[im 38/100]
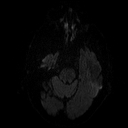
[im 50/100]
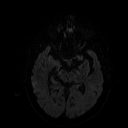
[im 62/100]
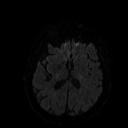
[im 75/100]
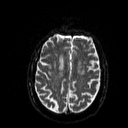
[im 87/100]
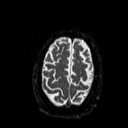
[im 100/100]
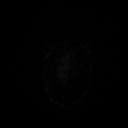

[Series 4: DWI · axial · 3.0mm · 1.80mm/px · z∈[-95,+52]mm · 4 of 46 slices shown (2 of 4)]
[im 1/46]
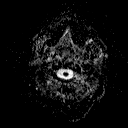
[im 16/46]
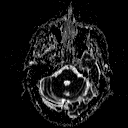
[im 31/46]
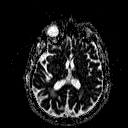
[im 46/46]
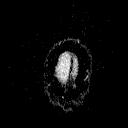

[Series 5: DWI · coronal · 5.0mm · 1.80mm/px · 6 of 68 slices shown (3 of 4)]
[im 1/68]
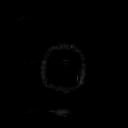
[im 14/68]
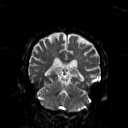
[im 27/68]
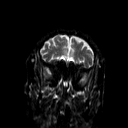
[im 41/68]
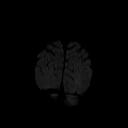
[im 54/68]
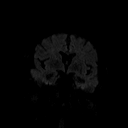
[im 68/68]
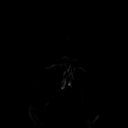

[Series 6: DWI · coronal · 5.0mm · 1.80mm/px · 3 of 34 slices shown (4 of 4)]
[im 1/34]
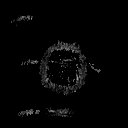
[im 17/34]
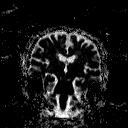
[im 34/34]
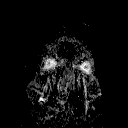

[Series 7: T2 · axial · 5.0mm · 0.90mm/px · z∈[-84,+60]mm · 2 of 22 slices shown (1 of 2)]
[im 1/22]
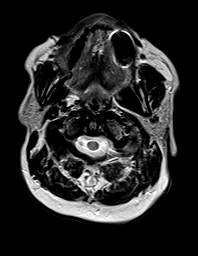
[im 22/22]
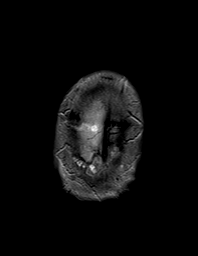

[Series 8: FLAIR · axial · 3.0mm · 0.45mm/px · z∈[-78,+54]mm · 3 of 30 slices shown]
[im 1/30]
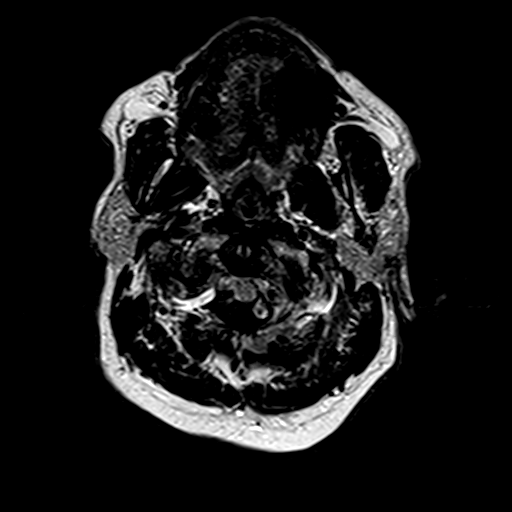
[im 15/30]
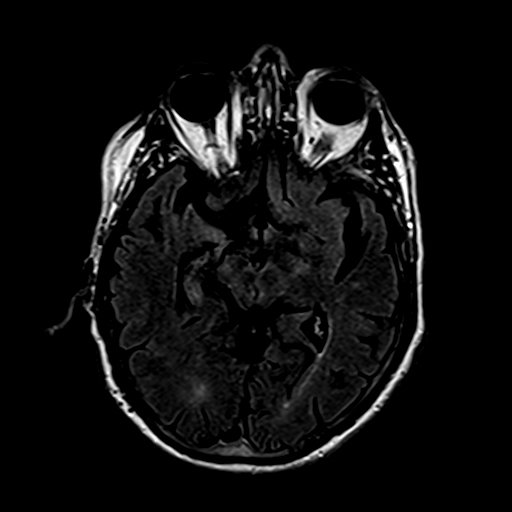
[im 30/30]
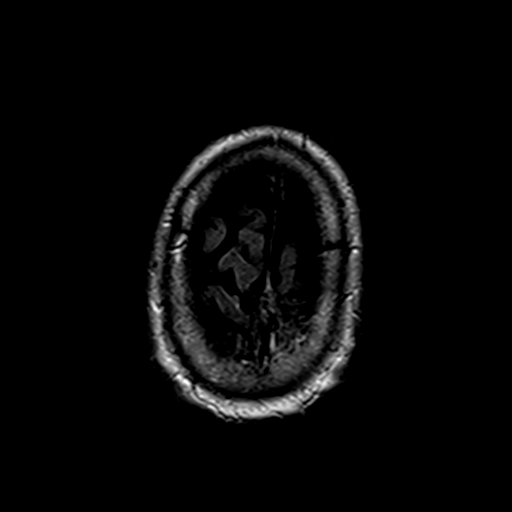

[Series 10: swi_images · axial · 4.0mm · 0.90mm/px · z∈[-81,+57]mm · 3 of 36 slices shown]
[im 1/36]
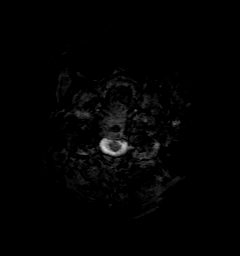
[im 18/36]
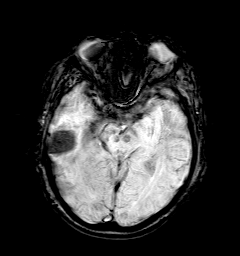
[im 36/36]
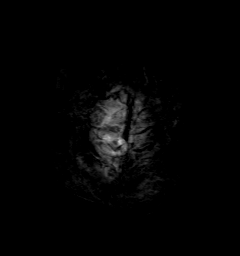

[Series 11: t1_mpr_tra · axial · 1.0mm · 0.71mm/px · z∈[-82,+58]mm · 13 of 144 slices shown]
[im 1/144]
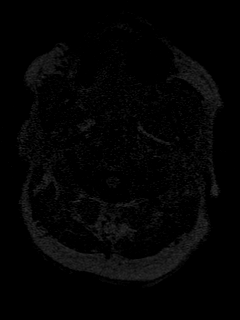
[im 12/144]
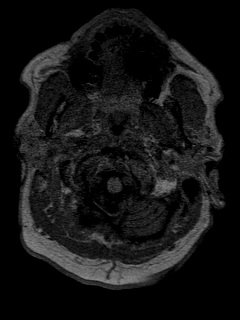
[im 24/144]
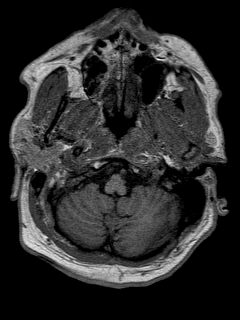
[im 36/144]
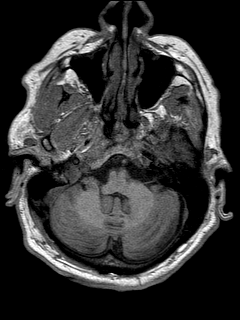
[im 48/144]
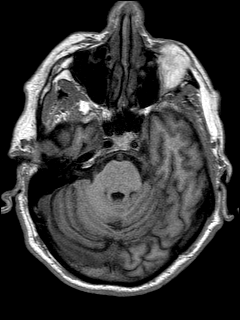
[im 60/144]
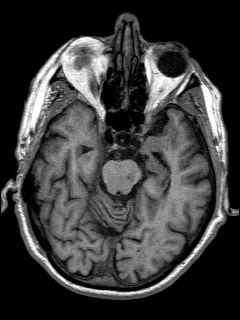
[im 72/144]
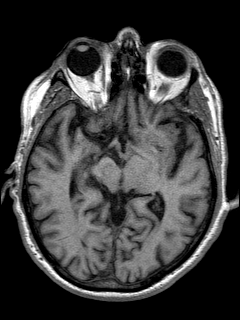
[im 84/144]
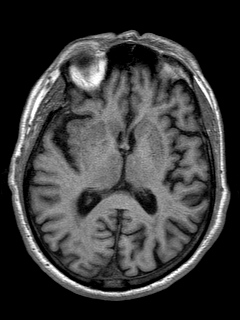
[im 96/144]
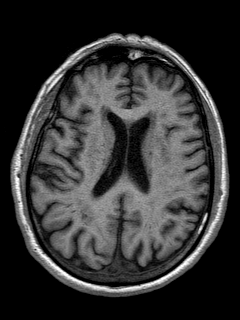
[im 108/144]
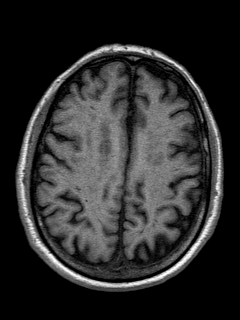
[im 120/144]
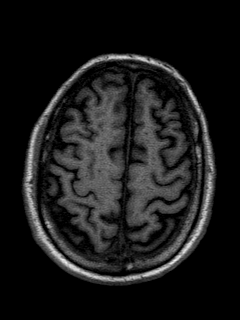
[im 132/144]
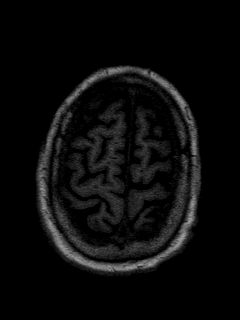
[im 144/144]
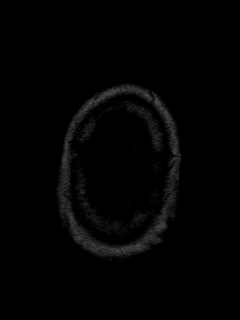

[Series 12: T2 · coronal · 5.0mm · 0.45mm/px · 2 of 25 slices shown (2 of 2)]
[im 1/25]
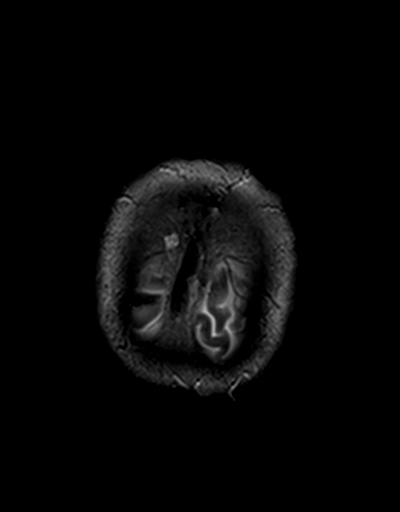
[im 25/25]
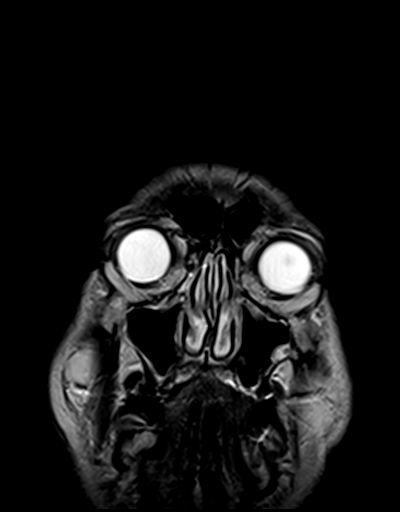

[48 of 48 positions shown; findings below may reference images not displayed]

FINDINGS: Brain: No diffusion-weighted signal abnormality. No intracranial
hemorrhage. No midline shift, ventriculomegaly or extra-axial fluid
collection. No mass lesion. Cerebral volume is within normal limits.
Moderate chronic microvascular ischemic changes. Tiny left
cerebellar lacunar insult. Small remote lacunar insults involving
the bilateral corona radiata, basal ganglia and midbrain.

Vascular: Major intracranial flow voids are preserved at the skull
base.

Skull and upper cervical spine: Normal marrow signal.

Sinuses/Orbits: Normal orbits. Mild pansinus mucosal thickening.
Pneumatized mastoid air cells.

Other: None.
IMPRESSION: No acute intracranial process. Moderate chronic microvascular
ischemic changes.

Chronic lacunar insults involving the corona radiata, basal ganglia,
midbrain and left cerebellum.

## 2021-08-16 ENCOUNTER — Encounter: Payer: Self-pay | Admitting: Neurology

## 2021-08-16 ENCOUNTER — Other Ambulatory Visit: Payer: Self-pay

## 2021-08-16 ENCOUNTER — Ambulatory Visit (INDEPENDENT_AMBULATORY_CARE_PROVIDER_SITE_OTHER): Payer: Medicare Other | Admitting: Neurology

## 2021-08-16 VITALS — BP 164/68 | HR 50 | Ht 70.0 in | Wt 163.6 lb

## 2021-08-16 DIAGNOSIS — G309 Alzheimer's disease, unspecified: Secondary | ICD-10-CM

## 2021-08-16 DIAGNOSIS — G250 Essential tremor: Secondary | ICD-10-CM

## 2021-08-16 DIAGNOSIS — G3184 Mild cognitive impairment, so stated: Secondary | ICD-10-CM

## 2021-08-16 NOTE — Progress Notes (Signed)
NEUROLOGY FOLLOW UP OFFICE NOTE  Victor Mcbride [id number] 1941-04-04  HISTORY OF PRESENT ILLNESS: I had the pleasure of seeing Victor Mcbride in follow-up in the neurology clinic on 08/16/2021.  The patient was last seen 8 months ago for memory loss. He is again accompanied by his wife who helps supplement the history today.  Records and images were personally reviewed where available. I personally reviewed MRI brain without contrast done 11/2020 which did not show any acute changes, there was mild diffuse atrophy and moderate chronic microvascular disease. 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 It was noted he is at risk for "mixed dementia" with AD worsened by vascular disease.   Since his last visit, he reports memory overall fine, he forgets some things, especially names of people he just met a few minutes prior. His wife notes that in the past 3-4 months, he would be in a conversation then 2-3 sentences later would repeat the exact same story he said previously. He continues to manage his own medications without issues. His wife manages finances. He denies getting lost driving, his wife denies any driving concerns. He denies misplacing things frequently. His wife has noted he gets a little more frustrated at times, no hallucinations or paranoia. Sleep is good but his wife feels the quality is not good, he reports feeling tired on awakening. He snores a little. No REM behaviors.They stay active during the day, no daytime drowsiness. He is on Donepezil 28m daily without side effects. He has bilateral hand tremors, he does not feel it has changed but his wife has noticed more tremors the past few months. It does not affect writing or using utensils.    History on Initial Assessment 12/07/2020: This is an 80year old right-handed man with a history of hypertension, hyperlipidemia,  presenting for evaluation of memory loss. He started reporting concerns in 2015, MMSE 30/30 at PCP office. Repeat MMSE in 01/2020 at Dr. SCarlyle Lipaoffice was 27/30. He and his wife wanted to start medication and he has been taking Donepezil 123mdaily for the past 4-5 years without side effects.  He states he is having issues with his memory, he cannot remember where he put things. His wife started noticing changes with his short-term memory 7-8 years ago, he would repeat the same question and forget details of her answer. There has been a fairly slow progression but changes have been more pronounced over the past year or so. He continues to drive without getting lost, his wife denies any driving concerns. He manages his own medications without difficulties. His wife manages finances, he had been doing the bigger investments and taxes and can still do it. He keeps repeating that he has become more dependent on her to do these things. No word-finding difficulties. He is independent with dressing and bathing, no hygiene concerns. He has noticed more difficulties reading, he has a hard time keeping track of characters. 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 He states mood is generally good, his wife notes he gets more frustrated now when he cannot remember something. No paranoia or hallucinations.  He denies any headaches, dizziness, diplopia, dysarthria, dysphagia, neck/back pain, focal numbness/tingling/weakness, bowel/bladder dysfunction, anosmia, no falls. He has had hand tremors for many years. He sleeps fine, but sometimes wakes up a few times and thinks about something. He walks it off and goes back to  sleep. He dozes off in the day sometimes. His wife notes snoring, no REM behavior disorder. His maternal grandmother had dementia in her 59s. He denies any significant head injuries. He and his wife split a bottle of wine every night, he drinks 1.5-2 oz of scotch sometimes. They  moved to [place] 3 years ago.    PAST MEDICAL HISTORY: Past Medical History:  Diagnosis Date   Hyperlipidemia    Hypertension    Mild neurocognitive disorder due to Alzheimer's disease, possible 12/22/2020    MEDICATIONS: Current Outpatient Medications on File Prior to Visit  Medication Sig Dispense Refill   amLODIPine Besylate-Celecoxib 2.5-200 MG TABS Take by mouth.     atenolol (TENORMIN) 25 MG tablet Take 25 mg by mouth daily.     cholecalciferol (VITAMIN D3) 25 MCG (1000 UNIT) tablet Take 1,000 Units by mouth daily.     donepezil (ARICEPT) 10 MG tablet Take 10 mg by mouth daily.     hydrochlorothiazide (MICROZIDE) 12.5 MG capsule Take 12.5 mg by mouth daily.     loratadine (CLARITIN) 10 MG tablet Take 10 mg by mouth daily.     losartan (COZAAR) 50 MG tablet Take 50 mg by mouth daily.     Multiple Vitamin (MULTIVITAMIN) tablet Take 1 tablet by mouth daily.     Omega-3 Fatty Acids (FISH OIL) 1000 MG CAPS Take 1,000 mg by mouth daily.     omeprazole (PRILOSEC) 20 MG capsule Take 20 mg by mouth 2 (two) times a week. Pt takes every Monday and Friday     simvastatin (ZOCOR) 10 MG tablet Take 10 mg by mouth daily.     aspirin 81 MG tablet Take 81 mg by mouth daily. (Patient not taking: Reported on 08/16/2021)     No current facility-administered medications on file prior to visit.    ALLERGIES: No Known Allergies  FAMILY HISTORY: Family History  Problem Relation Age of Onset   Dementia Maternal Grandmother     SOCIAL HISTORY: Social History   Socioeconomic History   Marital status: Married    Spouse name: Not on file   Number of children: Not on file   Years of education: 18   Highest education level: Master's degree (e.g., MA, MS, MEng, MEd, MSW, MBA)  Occupational History   Occupation: Retired    Comment: Finance  Tobacco Use   Smoking status: Never   Smokeless tobacco: Never  Vaping Use   Vaping Use: Never used  Substance and Sexual Activity   Alcohol  use: Yes    Comment: 1/2 bottle of wine nightly   Drug use: No   Sexual activity: Not on file  Other Topics Concern   Not on file  Social History Narrative   Right handed    Lives with wife    Social Determinants of Health   Financial Resource Strain: Not on file  Food Insecurity: Not on file  Transportation Needs: Not on file  Physical Activity: Not on file  Stress: Not on file  Social Connections: Not on file  Intimate Partner Violence: Not on file     PHYSICAL EXAM: Vitals:   08/16/21 0958  BP: (!) 164/68  Pulse: (!) 50  SpO2: 97%   General: No acute distress Head:  Normocephalic/atraumatic Skin/Extremities: No rash, no edema Neurological Exam: alert and oriented to person, place, and season/year/day of week. Month is August. No aphasia or dysarthria. Fund of knowledge is appropriate.  Remote memory intact.  Attention and concentration are normal. MMSE  25/30 MMSE - Mini Mental State Exam 08/16/2021  Orientation to time 3  Orientation to Place 5  Registration 3  Attention/ Calculation 5  Recall 0  Language- name 2 objects 2  Language- repeat 1  Language- follow 3 step command 3  Language- read & follow direction 1  Write a sentence 1  Copy design 1  Total score 25    Cranial nerves: Pupils equal, round. Extraocular movements intact with no nystagmus. Visual fields full.  No facial asymmetry.  Motor: Bulk and tone normal, no cogwheeling, muscle strength 5/5 throughout with no pronator drift.   Finger to nose testing intact.  Gait narrow-based and steady with good arm swing. There is a more noticeable left hand postural and endpoint tremor today. Mild right postural tremor.    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 MRI brain showed moderate chronic microvascular disease. MMSE today 25/30. Symptoms overall  stable, he continues to note short-term memory changes without difficulties with complex tasks. Continue Donepezil 51m daily. We again discussed the importance of control of vascular risk factors, physical exercise, and brain stimulation exercises for brain health. Continue to monitor driving. Continue to monitor tremor, no parkinsonian signs on exam today. Follow-up in 6 months with [facility] Disorder PA SSharene Butters They know to call for any changes.     Thank you for allowing me to participate in his care.  Please do not hesitate to call for any questions or concerns.    KEllouise Newer M.D.   CC: Dr. SFelipa Eth

## 2021-08-16 NOTE — Patient Instructions (Signed)
Good to see you!  Continue Donepezil 10mg  daily  2. Follow-up in 6 months, call for any changes   FALL PRECAUTIONS: Be cautious when walking. Scan the area for obstacles that may increase the risk of trips and falls. When getting up in the mornings, sit up at the edge of the bed for a few minutes before getting out of bed. Consider elevating the bed at the head end to avoid drop of blood pressure when getting up. Walk always in a well-lit room (use night lights in the walls). Avoid area rugs or power cords from appliances in the middle of the walkways. Use a walker or a cane if necessary and consider physical therapy for balance exercise. Get your eyesight checked regularly.  FINANCIAL OVERSIGHT: Supervision, especially oversight when making financial decisions or transactions is also recommended.  HOME SAFETY: Consider the safety of the kitchen when operating appliances like stoves, microwave oven, and blender. Consider having supervision and share cooking responsibilities until no longer able to participate in those. Accidents with firearms and other hazards in the house should be identified and addressed as well.  DRIVING: Regarding driving, in patients with progressive memory problems, driving will be impaired. We advise to have someone else do the driving if trouble finding directions or if minor accidents are reported. Independent driving assessment is available to determine safety of driving.  ABILITY TO BE LEFT ALONE: If patient is unable to contact 911 operator, consider using LifeLine, or when the need is there, arrange for someone to stay with patients. Smoking is a fire hazard, consider supervision or cessation. Risk of wandering should be assessed by caregiver and if detected at any point, supervision and safe proof recommendations should be instituted.  MEDICATION SUPERVISION: Inability to self-administer medication needs to be constantly addressed. Implement a mechanism to ensure safe  administration of the medications.  RECOMMENDATIONS FOR ALL PATIENTS WITH MEMORY PROBLEMS: 1. Continue to exercise (Recommend 30 minutes of walking everyday, or 3 hours every week) 2. Increase social interactions - continue going to [place] and enjoy social gatherings with friends and family 3. Eat healthy, avoid fried foods and eat more fruits and vegetables 4. Maintain adequate blood pressure, blood sugar, and blood cholesterol level. Reducing the risk of stroke and cardiovascular disease also helps promoting better memory. 5. Avoid stressful situations. Live a simple life and avoid aggravations. Organize your time and prepare for the next day in anticipation. 6. Sleep well, avoid any interruptions of sleep and avoid any distractions in the bedroom that may interfere with adequate sleep quality 7. Avoid sugar, avoid sweets as there is a strong link between excessive sugar intake, diabetes, and cognitive impairment We discussed the Mediterranean diet, which has been shown to help patients reduce the risk of progressive memory disorders and reduces cardiovascular risk. This includes eating fish, eat fruits and green leafy vegetables, nuts like almonds and hazelnuts, walnuts, and also use olive oil. Avoid fast foods and fried foods as much as possible. Avoid sweets and sugar as sugar use has been linked to worsening of memory function.  There is always a concern of gradual progression of memory problems. If this is the case, then we may need to adjust level of care according to patient needs. Support, both to the patient and caregiver, should then be put into place.

## 2021-08-17 DIAGNOSIS — G301 Alzheimer's disease with late onset: Secondary | ICD-10-CM | POA: Diagnosis not present

## 2021-08-17 DIAGNOSIS — Z79899 Other long term (current) drug therapy: Secondary | ICD-10-CM | POA: Diagnosis not present

## 2021-08-17 DIAGNOSIS — I10 Essential (primary) hypertension: Secondary | ICD-10-CM | POA: Diagnosis not present

## 2021-08-17 DIAGNOSIS — M79644 Pain in right finger(s): Secondary | ICD-10-CM | POA: Diagnosis not present

## 2021-08-17 DIAGNOSIS — F0280 Dementia in other diseases classified elsewhere without behavioral disturbance: Secondary | ICD-10-CM | POA: Diagnosis not present

## 2021-08-17 DIAGNOSIS — G250 Essential tremor: Secondary | ICD-10-CM | POA: Diagnosis not present

## 2021-12-15 DIAGNOSIS — H1131 Conjunctival hemorrhage, right eye: Secondary | ICD-10-CM | POA: Diagnosis not present

## 2021-12-29 DIAGNOSIS — H524 Presbyopia: Secondary | ICD-10-CM | POA: Diagnosis not present

## 2021-12-29 DIAGNOSIS — H25013 Cortical age-related cataract, bilateral: Secondary | ICD-10-CM | POA: Diagnosis not present

## 2021-12-29 DIAGNOSIS — H52203 Unspecified astigmatism, bilateral: Secondary | ICD-10-CM | POA: Diagnosis not present

## 2021-12-29 DIAGNOSIS — H2513 Age-related nuclear cataract, bilateral: Secondary | ICD-10-CM | POA: Diagnosis not present

## 2022-01-17 DIAGNOSIS — Z20822 Contact with and (suspected) exposure to covid-19: Secondary | ICD-10-CM | POA: Diagnosis not present

## 2022-01-17 DIAGNOSIS — R0981 Nasal congestion: Secondary | ICD-10-CM | POA: Diagnosis not present

## 2022-02-14 ENCOUNTER — Ambulatory Visit: Payer: Medicare Other | Admitting: Physician Assistant

## 2022-02-14 NOTE — Progress Notes (Incomplete)
? ?NEUROLOGY FOLLOW UP OFFICE NOTE ? ?Victor Mcbride ?[id number] ?01-20-1941 ? ?HISTORY OF PRESENT ILLNESS: ?I had the pleasure of seeing Victor Mcbride in follow-up in the neurology clinic on 08/16/2021.  The patient was last seen 8 months ago for memory loss. He is again accompanied by his wife who helps supplement the history today.  Records and images were personally reviewed where available. I personally reviewed MRI brain without contrast done 11/2020 which did not show any acute changes, there was mild diffuse atrophy and moderate chronic microvascular disease. 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 It was noted he is at risk for "mixed dementia" with AD worsened by vascular disease.  ? ?Since his last visit, he reports memory overall fine, he forgets some things, especially names of people he just met a few minutes prior. His wife notes that in the past 3-4 months, he would be in a conversation then 2-3 sentences later would repeat the exact same story he said previously. He continues to manage his own medications without issues. His wife manages finances. He denies getting lost driving, his wife denies any driving concerns. He denies misplacing things frequently. His wife has noted he gets a little more frustrated at times, no hallucinations or paranoia. Sleep is good but his wife feels the quality is not good, he reports feeling tired on awakening. He snores a little. No REM behaviors.They stay active during the day, no daytime drowsiness. He is on Donepezil 102m daily without side effects. He has bilateral hand tremors, he does not feel it has changed but his wife has noticed more tremors the past few months. It does not affect writing or using utensils.  ? ? ?History on Initial Assessment 12/07/2020: This is an 81year old right-handed man with a history of hypertension, hyperlipidemia,  presenting for evaluation of memory loss. He started reporting concerns in 2015, MMSE 30/30 at PCP office. Repeat MMSE in 01/2020 at Dr. [facility] was 27/30. He and his wife wanted to start medication and he has been taking Donepezil 129mdaily for the past 4-5 years without side effects. ? ?He states he is having issues with his memory, he cannot remember where he put things. His wife started noticing changes with his short-term memory 7-8 years ago, he would repeat the same question and forget details of her answer. There has been a fairly slow progression but changes have been more pronounced over the past year or so. He continues to drive without getting lost, his wife denies any driving concerns. He manages his own medications without difficulties. His wife manages finances, he had been doing the bigger investments and taxes and can still do it. He keeps repeating that he has become more dependent on her to do these things. No word-finding difficulties. He is independent with dressing and bathing, no hygiene concerns. He has noticed more difficulties reading, he has a hard time keeping track of characters. 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 He states mood is generally good, his wife notes he gets more frustrated now when he cannot remember something. No paranoia or hallucinations. ? ?He denies any headaches, dizziness, diplopia, dysarthria, dysphagia, neck/back pain, focal numbness/tingling/weakness, bowel/bladder dysfunction, anosmia, no falls. He has had hand tremors for many years. He sleeps fine, but sometimes wakes up a few times and thinks about something. He walks it off and goes back to  sleep. He dozes off in the day sometimes. His wife notes snoring, no REM behavior disorder. His maternal grandmother had dementia in her 82s. He denies any significant head injuries. He and his wife split a bottle of wine every night, he drinks 1.5-2 oz of scotch sometimes. They  moved to [place] 3 years ago.  ? ? ?PAST MEDICAL HISTORY: ?Past Medical History:  ?Diagnosis Date  ? Hyperlipidemia   ? Hypertension   ? Mild neurocognitive disorder due to Alzheimer's disease, possible 12/22/2020  ? ? ?MEDICATIONS: ?Current Outpatient Medications on File Prior to Visit  ?Medication Sig Dispense Refill  ? amLODIPine Besylate-Celecoxib 2.5-200 MG TABS Take by mouth.    ? aspirin 81 MG tablet Take 81 mg by mouth daily. (Patient not taking: Reported on 08/16/2021)    ? atenolol (TENORMIN) 25 MG tablet Take 25 mg by mouth daily.    ? cholecalciferol (VITAMIN D3) 25 MCG (1000 UNIT) tablet Take 1,000 Units by mouth daily.    ? donepezil (ARICEPT) 10 MG tablet Take 10 mg by mouth daily.    ? hydrochlorothiazide (MICROZIDE) 12.5 MG capsule Take 12.5 mg by mouth daily.    ? loratadine (CLARITIN) 10 MG tablet Take 10 mg by mouth daily.    ? losartan (COZAAR) 50 MG tablet Take 50 mg by mouth daily.    ? Multiple Vitamin (MULTIVITAMIN) tablet Take 1 tablet by mouth daily.    ? Omega-3 Fatty Acids (FISH OIL) 1000 MG CAPS Take 1,000 mg by mouth daily.    ? omeprazole (PRILOSEC) 20 MG capsule Take 20 mg by mouth 2 (two) times a week. Pt takes every Monday and Friday    ? simvastatin (ZOCOR) 10 MG tablet Take 10 mg by mouth daily.    ? ?No current facility-administered medications on file prior to visit.  ? ? ?ALLERGIES: ?No Known Allergies ? ?FAMILY HISTORY: ?Family History  ?Problem Relation Age of Onset  ? Dementia Maternal Grandmother   ? ? ?SOCIAL HISTORY: ?Social History  ? ?Socioeconomic History  ? Marital status: Married  ?  Spouse name: Not on file  ? Number of children: Not on file  ? Years of education: 45  ? Highest education level: Master's degree (e.g., MA, MS, MEng, MEd, MSW, MBA)  ?Occupational History  ? 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 Smoking status: Never  ? Smokeless tobacco: Never  ?Vaping Use  ? Vaping Use: Never used  ?Substance and Sexual Activity  ? Alcohol  use: Yes  ?  Comment: 1/2 bottle of wine nightly  ? Drug use: No  ? Sexual activity: Not on file  ?Other Topics Concern  ? Not on file  ?Social History Narrative  ? Right handed   ? Lives with wife   ? ?Social Determinants of Health  ? ?Financial Resource Strain: Not on file  ?Food Insecurity: Not on file  ?Transportation Needs: Not on file  ?Physical Activity: Not on file  ?Stress: Not on file  ?Social Connections: Not on file  ?Intimate Partner Violence: Not on file  ? ? ? ?PHYSICAL EXAM: ?There were no vitals filed for this visit. ? ?General: No acute distress ?Head:  Normocephalic/atraumatic ?Skin/Extremities: No rash, no edema ?Neurological Exam: alert and oriented to person, place, and season/year/day of week. Month is August. No aphasia or dysarthria. Fund of knowledge is appropriate.  Remote memory intact.  Attention and concentration are normal. MMSE 25/30 ?MMSE - Mini Mental State Exam 08/16/2021  ?  Orientation to time 3  ?Orientation to Place 5  ?Registration 3  ?Attention/ Calculation 5  ?Recall 0  ?Language- name 2 objects 2  ?Language- repeat 1  ?Language- follow 3 step command 3  ?Language- read & follow direction 1  ?Write a sentence 1  ?Copy design 1  ?Total score 25  ? ? Cranial nerves: Pupils equal, round. Extraocular movements intact with no nystagmus. Visual fields full.  No facial asymmetry.  Motor: Bulk and tone normal, no cogwheeling, muscle strength 5/5 throughout with no pronator drift.   Finger to nose testing intact.  Gait narrow-based and steady with good arm swing. There is a more noticeable left hand postural and endpoint tremor today. Mild right postural tremor.  ? ? 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 MRI brain showed moderate chronic microvascular disease. MMSE today 25/30. Symptoms overall stable, he continues to  note short-term memory changes without difficulties with complex tasks. Continue Donepezil 27m daily. We again discussed the importance of control of vascular risk factors, physical exercise, and brain stimulation

## 2022-02-28 DIAGNOSIS — L821 Other seborrheic keratosis: Secondary | ICD-10-CM | POA: Diagnosis not present

## 2022-02-28 DIAGNOSIS — L570 Actinic keratosis: Secondary | ICD-10-CM | POA: Diagnosis not present

## 2022-02-28 DIAGNOSIS — M7130 Other bursal cyst, unspecified site: Secondary | ICD-10-CM | POA: Diagnosis not present

## 2022-03-02 DIAGNOSIS — Z03818 Encounter for observation for suspected exposure to other biological agents ruled out: Secondary | ICD-10-CM | POA: Diagnosis not present

## 2022-03-02 DIAGNOSIS — J069 Acute upper respiratory infection, unspecified: Secondary | ICD-10-CM | POA: Diagnosis not present

## 2022-03-15 ENCOUNTER — Ambulatory Visit: Payer: Medicare Other | Admitting: Physician Assistant

## 2022-04-04 NOTE — Progress Notes (Signed)
? ?NEUROLOGY FOLLOW UP OFFICE NOTE ? ?Victor Mcbride ?[id number] ?Nov 27, 1941 ? ?HISTORY OF PRESENT ILLNESS: ? ?I had the pleasure of seeing Victor Mcbride in follow-up in the neurology clinic on 04/05/22. He was last seen on 08/16/2021 for memory loss. He is again accompanied by his wife who helps supplement the history today.  Records and images were personally reviewed where available. I personally reviewed MRI brain without contrast done 11/2020 which did not show any acute changes, there was mild diffuse atrophy and moderate chronic microvascular disease.  Neuropsychological testing in 11/2020 yielded a diagnosis of Amnestic Mild neurocognitive disorder,likely due to Alzheimer's disease, at risk for mixed AD and vascular dementia. He is on donepezil 10 mg daily, tolerating well. ?Since his last visit, he reports memory overall fine, he forgets some things, especially names of people he just met a few minutes prior.  He continues to do computer puzzles and reading extensively, stating that helped his memory very much.  His wife states that his short-term memory is still very much affected, forgetting recent conversations, or if presented with new words, he may forget any few minutes later.  The patient states that he uses tricks, such as checking information, or visualizing them, which helps".He continues to manage his own medications without issues.  His wife manages finances.  He denies getting lost driving, his wife denies any driving concerns.  He denies misplacing things frequently.  His mood is good, occasionally he gets frustrated when he cannot find a word, no hallucinations or paranoia. Sleep is good but his wife feels the quality is not good, he reports feeling tired on awakening. No REM behaviors.They stay active during the day, no daytime drowsiness. He has bilateral hand tremors left greater than right, he does not feel it has changed but his wife has noticed more tremors the past few months.  He denies  any other parkinsonian signs. Tremors do not affect writing or using utensils.  ? ? ?History on Initial Assessment 12/07/2020: This is an 81 year old right-handed man with a history of hypertension, hyperlipidemia, presenting for evaluation of memory loss. He started reporting concerns in 2015, MMSE 30/30 at PCP office. Repeat MMSE in 01/2020 at Dr. Carlyle Lipa office was 27/30. He and his wife wanted to start medication and he has been taking Donepezil 11m daily for the past 4-5 years without side effects. ? ?He states he is having issues with his memory, he cannot remember where he put things. His wife started noticing changes with his short-term memory 7-8 years ago, he would repeat the same question and forget details of her answer. There has been a fairly slow progression but changes have been more pronounced over the past year or so. He continues to drive without getting lost, his wife denies any driving concerns. He manages his own medications without difficulties. His wife manages finances, he had been doing the bigger investments and taxes and can still do it. He keeps repeating that he has become more dependent on her to do these things. No word-finding difficulties. He is independent with dressing and bathing, no hygiene concerns. He has noticed more difficulties reading, he has a hard time keeping track of characters. 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 He states mood is generally good, his wife notes he gets more frustrated now when he cannot remember something. No paranoia or hallucinations. ? ?He denies any headaches, dizziness, diplopia, dysarthria, dysphagia, neck/back pain, focal numbness/tingling/weakness, bowel/bladder dysfunction, anosmia,  no falls. He has had hand tremors for many years. He sleeps fine, but sometimes wakes up a few times and thinks about something. He walks it off and goes back to sleep. He dozes off in the day sometimes. His wife notes snoring,  no REM behavior disorder. His maternal grandmother had dementia in her 74s. He denies any significant head injuries. He and his wife split a bottle of wine every night, he drinks 1.5-2 oz of scotch sometimes. They moved to [place] 3 years ago.  ? ? ?PAST MEDICAL HISTORY: ?Past Medical History:  ?Diagnosis Date  ? Hyperlipidemia   ? Hypertension   ? Mild neurocognitive disorder due to Alzheimer's disease, possible 12/22/2020  ? ? ?MEDICATIONS: ?Current Outpatient Medications on File Prior to Visit  ?Medication Sig Dispense Refill  ? amLODIPine Besylate-Celecoxib 2.5-200 MG TABS Take by mouth.    ? atenolol (TENORMIN) 25 MG tablet Take 25 mg by mouth daily.    ? cholecalciferol (VITAMIN D3) 25 MCG (1000 UNIT) tablet Take 1,000 Units by mouth daily.    ? donepezil (ARICEPT) 10 MG tablet Take 10 mg by mouth daily.    ? hydrochlorothiazide (MICROZIDE) 12.5 MG capsule Take 12.5 mg by mouth daily.    ? loratadine (CLARITIN) 10 MG tablet Take 10 mg by mouth daily.    ? losartan (COZAAR) 50 MG tablet Take 50 mg by mouth daily.    ? Multiple Vitamin (MULTIVITAMIN) tablet Take 1 tablet by mouth daily.    ? Omega-3 Fatty Acids (FISH OIL) 1000 MG CAPS Take 1,000 mg by mouth daily.    ? omeprazole (PRILOSEC) 20 MG capsule Take 20 mg by mouth 2 (two) times a week. Pt takes every Monday and Friday    ? simvastatin (ZOCOR) 10 MG tablet Take 10 mg by mouth daily.    ? aspirin 81 MG tablet Take 81 mg by mouth daily. (Patient not taking: Reported on 04/05/2022)    ? ?No current facility-administered medications on file prior to visit.  ? ? ?ALLERGIES: ?No Known Allergies ? ?FAMILY HISTORY: ?Family History  ?Problem Relation Age of Onset  ? Dementia Maternal Grandmother   ? ? ?SOCIAL HISTORY: ?Social History  ? ?Socioeconomic History  ? Marital status: Married  ?  Spouse name: Not on file  ? Number of children: Not on file  ? Years of education: 64  ? Highest education level: Master's degree (e.g., MA, MS, MEng, MEd, MSW, MBA)   ?Occupational History  ? 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 Smoking status: Never  ? Smokeless tobacco: Never  ?Vaping Use  ? Vaping Use: Never used  ?Substance and Sexual Activity  ? Alcohol use: Yes  ?  Comment: 1/2 bottle of wine nightly  ? Drug use: No  ? Sexual activity: Not on file  ?Other Topics Concern  ? Not on file  ?Social History Narrative  ? Right handed   ? Lives with wife   ? ?Social Determinants of Health  ? ?Financial Resource Strain: Not on file  ?Food Insecurity: Not on file  ?Transportation Needs: Not on file  ?Physical Activity: Not on file  ?Stress: Not on file  ?Social Connections: Not on file  ?Intimate Partner Violence: Not on file  ? ? ? ?PHYSICAL EXAM: ?Vitals:  ? 04/05/22 1055  ?BP: (!) 155/77  ?Pulse: 92  ?Resp: 18  ?SpO2: 98%  ? ? ?General: No acute distress ?Head:  Normocephalic/atraumatic ?Skin/Extremities: No rash, no edema ?  Neurological Exam: alert and oriented to person, place, and season/year/day of week. Month is August. No aphasia or dysarthria. Fund of knowledge is appropriate.  Remote memory intact.  Attention and concentration are normal. MMSE 25/30 ? ?  04/05/2022  ? 12:00 PM 08/16/2021  ? 12:00 PM  ?MMSE - Mini Mental State Exam  ?Orientation to time 5 3  ?Orientation to Place 4 5  ?Registration 3 3  ?Attention/ Calculation 5 5  ?Recall 0 0  ?Language- name 2 objects 2 2  ?Language- repeat 1 1  ?Language- follow 3 step command 3 3  ?Language- read & follow direction 1 1  ?Write a sentence 1 1  ?Copy design 1 1  ?Total score 26 25  ? ? Cranial nerves: Pupils equal, round. Extraocular movements intact with no nystagmus. Visual fields full.  No facial asymmetry.  Motor: Bulk and tone normal, no cogwheeling, muscle strength 5/5 throughout with no pronator drift.   Finger to nose testing intact.  Gait narrow-based and steady with good arm swing. There is a more noticeable left hand postural and endpoint tremor today. Mild right postural tremor.   ? ? 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 MRI brain

## 2022-04-05 ENCOUNTER — Encounter: Payer: Self-pay | Admitting: Physician Assistant

## 2022-04-05 ENCOUNTER — Ambulatory Visit (INDEPENDENT_AMBULATORY_CARE_PROVIDER_SITE_OTHER): Payer: Medicare Other | Admitting: Physician Assistant

## 2022-04-05 VITALS — BP 155/77 | HR 92 | Resp 18 | Ht 70.0 in | Wt 163.0 lb

## 2022-04-05 DIAGNOSIS — G309 Alzheimer's disease, unspecified: Secondary | ICD-10-CM | POA: Diagnosis not present

## 2022-04-05 DIAGNOSIS — G250 Essential tremor: Secondary | ICD-10-CM | POA: Diagnosis not present

## 2022-04-05 DIAGNOSIS — F0670 Mild neurocognitive disorder due to known physiological condition without behavioral disturbance: Secondary | ICD-10-CM | POA: Diagnosis not present

## 2022-04-05 NOTE — Patient Instructions (Signed)
Good to see you! ? ?Continue Donepezil '10mg'$  daily ? ?2. Follow-up in 6 months  ? ? ?FALL PRECAUTIONS: Be cautious when walking. Scan the area for obstacles that may increase the risk of trips and falls. When getting up in the mornings, sit up at the edge of the bed for a few minutes before getting out of bed. Consider elevating the bed at the head end to avoid drop of blood pressure when getting up. Walk always in a well-lit room (use night lights in the walls). Avoid area rugs or power cords from appliances in the middle of the walkways. Use a walker or a cane if necessary and consider physical therapy for balance exercise. Get your eyesight checked regularly. ? ?FINANCIAL OVERSIGHT: Supervision, especially oversight when making financial decisions or transactions is also recommended. ? ?HOME SAFETY: Consider the safety of the kitchen when operating appliances like stoves, microwave oven, and blender. Consider having supervision and share cooking responsibilities until no longer able to participate in those. Accidents with firearms and other hazards in the house should be identified and addressed as well. ? ?DRIVING: Regarding driving, in patients with progressive memory problems, driving will be impaired. We advise to have someone else do the driving if trouble finding directions or if minor accidents are reported. Independent driving assessment is available to determine safety of driving. ? ?ABILITY TO BE LEFT ALONE: If patient is unable to contact 911 operator, consider using LifeLine, or when the need is there, arrange for someone to stay with patients. Smoking is a fire hazard, consider supervision or cessation. Risk of wandering should be assessed by caregiver and if detected at any point, supervision and safe proof recommendations should be instituted. ? ?MEDICATION SUPERVISION: Inability to self-administer medication needs to be constantly addressed. Implement a mechanism to ensure safe administration of  the medications. ? ?RECOMMENDATIONS FOR ALL PATIENTS WITH MEMORY PROBLEMS: ?1. Continue to exercise (Recommend 30 minutes of walking everyday, or 3 hours every week) ?2. Increase social interactions - continue going to [place] and enjoy social gatherings with friends and family ?3. Eat healthy, avoid fried foods and eat more fruits and vegetables ?4. Maintain adequate blood pressure, blood sugar, and blood cholesterol level. Reducing the risk of stroke and cardiovascular disease also helps promoting better memory. ?5. Avoid stressful situations. Live a simple life and avoid aggravations. Organize your time and prepare for the next day in anticipation. ?6. Sleep well, avoid any interruptions of sleep and avoid any distractions in the bedroom that may interfere with adequate sleep quality ?7. Avoid sugar, avoid sweets as there is a strong link between excessive sugar intake, diabetes, and cognitive impairment ?We discussed the Mediterranean diet, which has been shown to help patients reduce the risk of progressive memory disorders and reduces cardiovascular risk. This includes eating fish, eat fruits and green leafy vegetables, nuts like almonds and hazelnuts, walnuts, and also use olive oil. Avoid fast foods and fried foods as much as possible. Avoid sweets and sugar as sugar use has been linked to worsening of memory function. ? ?There is always a concern of gradual progression of memory problems. If this is the case, then we may need to adjust level of care according to patient needs. Support, both to the patient and caregiver, should then be put into place. ? ?

## 2022-04-06 DIAGNOSIS — F0280 Dementia in other diseases classified elsewhere without behavioral disturbance: Secondary | ICD-10-CM | POA: Diagnosis not present

## 2022-04-06 DIAGNOSIS — G250 Essential tremor: Secondary | ICD-10-CM | POA: Diagnosis not present

## 2022-04-06 DIAGNOSIS — I10 Essential (primary) hypertension: Secondary | ICD-10-CM | POA: Diagnosis not present

## 2022-04-06 DIAGNOSIS — G301 Alzheimer's disease with late onset: Secondary | ICD-10-CM | POA: Diagnosis not present

## 2022-04-06 DIAGNOSIS — Z79899 Other long term (current) drug therapy: Secondary | ICD-10-CM | POA: Diagnosis not present

## 2022-04-06 DIAGNOSIS — M109 Gout, unspecified: Secondary | ICD-10-CM | POA: Diagnosis not present

## 2022-04-06 DIAGNOSIS — Z0000 Encounter for general adult medical examination without abnormal findings: Secondary | ICD-10-CM | POA: Diagnosis not present

## 2022-04-06 DIAGNOSIS — E7800 Pure hypercholesterolemia, unspecified: Secondary | ICD-10-CM | POA: Diagnosis not present

## 2022-04-06 DIAGNOSIS — Z1331 Encounter for screening for depression: Secondary | ICD-10-CM | POA: Diagnosis not present

## 2022-07-12 DIAGNOSIS — H65192 Other acute nonsuppurative otitis media, left ear: Secondary | ICD-10-CM | POA: Diagnosis not present

## 2022-08-16 ENCOUNTER — Ambulatory Visit: Payer: Medicare Other | Admitting: Neurology

## 2022-08-22 ENCOUNTER — Ambulatory Visit
Admission: RE | Admit: 2022-08-22 | Discharge: 2022-08-22 | Disposition: A | Discharge status: Home / Self Care | Payer: Medicare Other | Source: Ambulatory Visit | Attending: Internal Medicine | Admitting: Internal Medicine

## 2022-08-22 ENCOUNTER — Other Ambulatory Visit: Payer: Self-pay | Admitting: Internal Medicine

## 2022-08-22 DIAGNOSIS — M79644 Pain in right finger(s): Secondary | ICD-10-CM | POA: Diagnosis not present

## 2022-08-22 DIAGNOSIS — R52 Pain, unspecified: Secondary | ICD-10-CM

## 2022-08-22 DIAGNOSIS — J9811 Atelectasis: Secondary | ICD-10-CM | POA: Diagnosis not present

## 2022-08-22 DIAGNOSIS — R0781 Pleurodynia: Secondary | ICD-10-CM | POA: Diagnosis not present

## 2022-08-22 DIAGNOSIS — M7989 Other specified soft tissue disorders: Secondary | ICD-10-CM | POA: Diagnosis not present

## 2022-08-26 DIAGNOSIS — S62524A Nondisplaced fracture of distal phalanx of right thumb, initial encounter for closed fracture: Secondary | ICD-10-CM | POA: Diagnosis not present

## 2022-08-26 DIAGNOSIS — M79644 Pain in right finger(s): Secondary | ICD-10-CM | POA: Diagnosis not present

## 2022-08-26 DIAGNOSIS — S62511A Displaced fracture of proximal phalanx of right thumb, initial encounter for closed fracture: Secondary | ICD-10-CM | POA: Diagnosis not present

## 2022-09-12 DIAGNOSIS — S62524A Nondisplaced fracture of distal phalanx of right thumb, initial encounter for closed fracture: Secondary | ICD-10-CM | POA: Diagnosis not present

## 2022-09-12 DIAGNOSIS — M79644 Pain in right finger(s): Secondary | ICD-10-CM | POA: Diagnosis not present

## 2022-09-12 DIAGNOSIS — S62511A Displaced fracture of proximal phalanx of right thumb, initial encounter for closed fracture: Secondary | ICD-10-CM | POA: Diagnosis not present

## 2022-09-26 DIAGNOSIS — S62524A Nondisplaced fracture of distal phalanx of right thumb, initial encounter for closed fracture: Secondary | ICD-10-CM | POA: Diagnosis not present

## 2022-09-26 DIAGNOSIS — S62511A Displaced fracture of proximal phalanx of right thumb, initial encounter for closed fracture: Secondary | ICD-10-CM | POA: Diagnosis not present

## 2022-09-26 DIAGNOSIS — M79644 Pain in right finger(s): Secondary | ICD-10-CM | POA: Diagnosis not present

## 2022-10-04 DIAGNOSIS — I10 Essential (primary) hypertension: Secondary | ICD-10-CM | POA: Diagnosis not present

## 2022-10-07 ENCOUNTER — Encounter: Payer: Self-pay | Admitting: Physician Assistant

## 2022-10-07 ENCOUNTER — Ambulatory Visit (INDEPENDENT_AMBULATORY_CARE_PROVIDER_SITE_OTHER): Payer: Medicare Other | Admitting: Physician Assistant

## 2022-10-07 VITALS — BP 145/73 | HR 75 | Ht 70.0 in | Wt 159.8 lb

## 2022-10-07 DIAGNOSIS — G3184 Mild cognitive impairment, so stated: Secondary | ICD-10-CM

## 2022-10-07 DIAGNOSIS — R413 Other amnesia: Secondary | ICD-10-CM | POA: Diagnosis not present

## 2022-10-07 DIAGNOSIS — G309 Alzheimer's disease, unspecified: Secondary | ICD-10-CM

## 2022-10-07 DIAGNOSIS — R4189 Other symptoms and signs involving cognitive functions and awareness: Secondary | ICD-10-CM

## 2022-10-07 DIAGNOSIS — F0670 Mild neurocognitive disorder due to known physiological condition without behavioral disturbance: Secondary | ICD-10-CM | POA: Diagnosis not present

## 2022-10-07 NOTE — Progress Notes (Signed)
Assessment/Plan:   Mild Cognitive Impairment , likely due to Alzheimer's Disease   Victor Mcbride is a very pleasant 81 y.o. RH male presenting today in follow-up for evaluation of memory loss. Patient is on donepezil 10 mg daily, tolerating well. Prior MRI of the brain personally reviewed (January 2022), remarkable for mild diffuse atrophy and moderate chronic microvascular disease, without any acute findings.  Patient is stable from the cognitive standpoint.   Recommendations:   Follow up in 6  months. Continue donepezil 10 mg daily Continue to control cardiovascular risk factors Continue to control mood as per PCP Continue to monitor tremor, without parkinsonian signs on exam at this time Repeat Neuropsych testing sometime late next year for clarity of diagnosis and disease trajectory      Subjective:   This patient is accompanied in the office by his wife who supplements the history. Previous records as well as any outside records available were reviewed prior to todays visit.  Last seen on 04/05/2022, at which time MMSE was 26/30    Any changes in memory since last visit?  He denies any new changes.  He continues to occasionally forget the names of people he just met.  He continues to the computer puzzles and reading extensively, which helps his memory very much. He does forget recent conversations per wife's report.  He uses tricks, such as checking information or visualizing them.  repeats oneself?  Endorsed, especially telling the same story -wife says  Disoriented when walking into a room?  Patient denies   Leaving objects in unusual places?  Patient denies   Ambulates  with difficulty?   Patient denies   Recent falls?  The patient stepped on a broken pavement and hit his R thumb, needed a cast due to fracture, but denies any head injuries or LOC Any head injuries?  Patient denies   History of seizures?   Patient denies   Wandering behavior?  Patient denies   Patient  drives?  No issues  Any mood changes since last visit?  He may get frustrated if he cannot remember word. Any worsening depression?:  Patient denies   Hallucinations?  Patient denies   Paranoia?  Patient denies   Patient reports that sleeps well without vivid dreams, REM behavior or sleepwalking   History of sleep apnea?  Patient denies   Any hygiene concerns?  Patient denies   Independent of bathing and dressing?  Endorsed  Does the patient needs help with medications?  Patient is in charge  Who is in charge of the finances?  Wife is in charge    Any changes in appetite?  Patient denies    Patient have trouble swallowing? Patient denies   Does the patient cook?  Patient denies   Any headaches?  Patient denies   Double vision? Patient denies   Any focal numbness or tingling?  Patient denies   Chronic back pain Patient denies   Unilateral weakness?  Patient denies   Any tremors?  He has a history of bilateral hand tremors, left greater than right, but does not affect his life ADLs he denies any other parkinsonian signs. Any history of anosmia?  Patient denies   Any incontinence of urine?  Patient denies   Any bowel dysfunction?   Patient denies      Patient lives with wife.  He is excited to take a trip to [place] soon, and he also travels to the mountains during the summer  History on Initial Assessment 12/07/2020: This is an 81 year old right-handed man with a history of hypertension, hyperlipidemia, presenting for evaluation of memory loss. He started reporting concerns in 2015, MMSE 30/30 at PCP office. Repeat MMSE in 01/2020 at Dr. Carlyle Mcbride office was 27/30. He and his wife wanted to start medication and he has been taking Donepezil 85m daily for the past 4-5 years without side effects.   He states he is having issues with his memory, he cannot remember where he put things. His wife started noticing changes with his short-term memory 7-8 years ago, he would repeat the same  question and forget details of her answer. There has been a fairly slow progression but changes have been more pronounced over the past year or so. He continues to drive without getting lost, his wife denies any driving concerns. He manages his own medications without difficulties. His wife manages finances, he had been doing the bigger investments and taxes and can still do it. He keeps repeating that he has become more dependent on her to do these things. No word-finding difficulties. He is independent with dressing and bathing, no hygiene concerns. He has noticed more difficulties reading, he has a hard time keeping track of characters. 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 He states mood is generally good, his wife notes he gets more frustrated now when he cannot remember something. No paranoia or hallucinations.   He denies any headaches, dizziness, diplopia, dysarthria, dysphagia, neck/back pain, focal numbness/tingling/weakness, bowel/bladder dysfunction, anosmia, no falls. He has had hand tremors for many years. He sleeps fine, but sometimes wakes up a few times and thinks about something. He walks it off and goes back to sleep. He dozes off in the day sometimes. His wife notes snoring, no REM behavior disorder. His maternal grandmother had dementia in her 993s He denies any significant head injuries. He and his wife split a bottle of wine every night, he drinks 1.5-2 oz of scotch sometimes. [id number].    Neuropsych evaluation 12/2020  Briefly, results suggested significant impairment surrounding retrieval and consolidation aspects of memory. An additional impairment was exhibited across semantic fluency, as well as an isolated line orientation task. Performance variability was exhibited across complex attention and response inhibition. Specific to memory, Victor Mcbride largely amnestic across delayed memory trials and performed far below  expectation across yes/no recognition trials. 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 In addition, his wife's report of the gradual decline of cognitive abilities over the course of several years is very consistent with this condition. While a noted weakness in semantic fluency relative to phonemic fluency is further consistent with this presentation, he did perform well across confrontation naming, executive functioning, and visuoconstructional tasks which suggests that this disease process remains in earlier stages if indeed present.   Past Medical History:  Diagnosis Date   Hyperlipidemia    Hypertension    Mild neurocognitive disorder due to Alzheimer's disease, possible 12/22/2020     History reviewed. No pertinent surgical history.   PREVIOUS MEDICATIONS:   CURRENT MEDICATIONS:  Outpatient Encounter Medications as of 10/07/2022  Medication Sig   amLODIPine Besylate-Celecoxib 2.5-200 MG TABS Take by mouth.   atenolol (TENORMIN) 25 MG tablet Take 25 mg by mouth daily.   cholecalciferol (VITAMIN D3) 25 MCG (1000 UNIT) tablet Take 1,000 Units by mouth daily.   donepezil (ARICEPT)  10 MG tablet Take 10 mg by mouth daily.   loratadine (CLARITIN) 10 MG tablet Take 10 mg by mouth daily.   losartan (COZAAR) 50 MG tablet Take 50 mg by mouth daily.   Multiple Vitamin (MULTIVITAMIN) tablet Take 1 tablet by mouth daily.   Omega-3 Fatty Acids (FISH OIL) 1000 MG CAPS Take 1,000 mg by mouth daily.   simvastatin (ZOCOR) 10 MG tablet Take 10 mg by mouth daily.   aspirin 81 MG tablet Take 81 mg by mouth daily. (Patient not taking: Reported on 04/05/2022)   hydrochlorothiazide (MICROZIDE) 12.5 MG capsule Take 12.5 mg by mouth daily. (Patient not taking: Reported on 10/07/2022)   omeprazole (PRILOSEC) 20 MG capsule Take 20 mg by mouth 2 (two) times a week. Pt takes every Monday and Friday (Patient not taking: Reported on 10/07/2022)   No  facility-administered encounter medications on file as of 10/07/2022.     Objective:     PHYSICAL EXAMINATION:    VITALS:   Vitals:   10/07/22 1430  BP: (!) 145/73  Pulse: 75  SpO2: 99%  Weight: 159 lb 12.8 oz (72.5 kg)  Height: _0  (1.778 m)    GEN:  The patient appears stated age and is in NAD. HEENT:  Normocephalic, atraumatic.   Neurological examination:  General: NAD, well-groomed, appears stated age. Orientation: The patient is alert. Oriented to person, place and date Cranial nerves: There is good facial symmetry.The speech is fluent and clear. No aphasia or dysarthria. Fund of knowledge is appropriate. Recent memory impaired and remote memory is normal.  Attention and concentration are normal.  Able to name objects and repeat phrases.  Hearing is intact to conversational tone.    Sensation: Sensation is intact to light touch throughout Motor: Strength is at least antigravity x4. 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 DTR's 2/4 in UE/LE      12/07/2020   10:00 AM  Montreal Cognitive Assessment   Visuospatial/ Executive (0/5) 3  Naming (0/3) 3  Attention: Read list of digits (0/2) 2  Attention: Read list of letters (0/1) 1  Attention: Serial 7 subtraction starting at 100 (0/3) 3  Language: Repeat phrase (0/2) 2  Language : Fluency (0/1) 0  Abstraction (0/2) 2  Delayed Recall (0/5) 0  Orientation (0/6) 4  Total 20       04/05/2022   12:00 PM 08/16/2021   12:00 PM  MMSE - Mini Mental State Exam  Orientation to time 5 3  Orientation to Place 4 5  Registration 3 3  Attention/ Calculation 5 5  Recall 0 0  Language- name 2 objects 2 2  Language- repeat 1 1  Language- follow 3 step command 3 3  Language- read & follow direction 1 1  Write a sentence 1 1  Copy design 1 1  Total score 26 25       Movement examination: Tone: There is normal tone in the UE/LE Abnormal movements:  No myoclonus.  No asterixis.    Coordination:  There is no decremation with RAM's. Normal finger to nose  Gait and Station: The patient has no difficulty arising out of a deep-seated chair without the use of the hands. The patient's stride length is good.  Gait is cautious and narrow.   Thank you for allowing [place] the opportunity to participate in the care of this nice patient. Please do not hesitate to contact us for any questions or concerns.   Total time  spent on today's visit was 22 minutes dedicated to this patient today, preparing to see patient, examining the patient, ordering tests and/or medications and counseling the patient, documenting clinical information in the EHR or other health record, independently interpreting results and communicating results to the patient/family, discussing treatment and goals, answering patient's questions and coordinating care.  Cc:  Lajean Manes, MD  Sharene Butters 10/07/2022 3:49 PM

## 2022-10-07 NOTE — Patient Instructions (Signed)
Good to see you!  Continue Donepezil '10mg'$  daily Recommend repeat neuropsych evaluation later in 2024  3    Follow-up in 6 months    FALL PRECAUTIONS: Be cautious when walking. Scan the area for obstacles that may increase the risk of trips and falls. When getting up in the mornings, sit up at the edge of the bed for a few minutes before getting out of bed. Consider elevating the bed at the head end to avoid drop of blood pressure when getting up. Walk always in a well-lit room (use night lights in the walls). Avoid area rugs or power cords from appliances in the middle of the walkways. Use a walker or a cane if necessary and consider physical therapy for balance exercise. Get your eyesight checked regularly.  FINANCIAL OVERSIGHT: Supervision, especially oversight when making financial decisions or transactions is also recommended.  HOME SAFETY: Consider the safety of the kitchen when operating appliances like stoves, microwave oven, and blender. Consider having supervision and share cooking responsibilities until no longer able to participate in those. Accidents with firearms and other hazards in the house should be identified and addressed as well.  DRIVING: Regarding driving, in patients with progressive memory problems, driving will be impaired. We advise to have someone else do the driving if trouble finding directions or if minor accidents are reported. Independent driving assessment is available to determine safety of driving.  ABILITY TO BE LEFT ALONE: If patient is unable to contact 911 operator, consider using LifeLine, or when the need is there, arrange for someone to stay with patients. Smoking is a fire hazard, consider supervision or cessation. Risk of wandering should be assessed by caregiver and if detected at any point, supervision and safe proof recommendations should be instituted.  MEDICATION SUPERVISION: Inability to self-administer medication needs to be constantly addressed.  Implement a mechanism to ensure safe administration of the medications.  RECOMMENDATIONS FOR ALL PATIENTS WITH MEMORY PROBLEMS: 1. Continue to exercise (Recommend 30 minutes of walking everyday, or 3 hours every week) 2. Increase social interactions - continue going to [place] and enjoy social gatherings with friends and family 3. Eat healthy, avoid fried foods and eat more fruits and vegetables 4. Maintain adequate blood pressure, blood sugar, and blood cholesterol level. Reducing the risk of stroke and cardiovascular disease also helps promoting better memory. 5. Avoid stressful situations. Live a simple life and avoid aggravations. Organize your time and prepare for the next day in anticipation. 6. Sleep well, avoid any interruptions of sleep and avoid any distractions in the bedroom that may interfere with adequate sleep quality 7. Avoid sugar, avoid sweets as there is a strong link between excessive sugar intake, diabetes, and cognitive impairment We discussed the Mediterranean diet, which has been shown to help patients reduce the risk of progressive memory disorders and reduces cardiovascular risk. This includes eating fish, eat fruits and green leafy vegetables, nuts like almonds and hazelnuts, walnuts, and also use olive oil. Avoid fast foods and fried foods as much as possible. Avoid sweets and sugar as sugar use has been linked to worsening of memory function.  There is always a concern of gradual progression of memory problems. If this is the case, then we may need to adjust level of care according to patient needs. Support, both to the patient and caregiver, should then be put into place.

## 2022-11-08 DIAGNOSIS — I10 Essential (primary) hypertension: Secondary | ICD-10-CM | POA: Diagnosis not present

## 2023-01-09 DIAGNOSIS — H25013 Cortical age-related cataract, bilateral: Secondary | ICD-10-CM | POA: Diagnosis not present

## 2023-01-09 DIAGNOSIS — H2513 Age-related nuclear cataract, bilateral: Secondary | ICD-10-CM | POA: Diagnosis not present

## 2023-01-09 DIAGNOSIS — H52203 Unspecified astigmatism, bilateral: Secondary | ICD-10-CM | POA: Diagnosis not present

## 2023-02-27 DIAGNOSIS — M79652 Pain in left thigh: Secondary | ICD-10-CM | POA: Diagnosis not present

## 2023-02-27 DIAGNOSIS — M25552 Pain in left hip: Secondary | ICD-10-CM | POA: Diagnosis not present

## 2023-02-28 ENCOUNTER — Ambulatory Visit
Admission: RE | Admit: 2023-02-28 | Discharge: 2023-02-28 | Disposition: A | Discharge status: Home / Self Care | Payer: Medicare Other | Source: Ambulatory Visit | Attending: Internal Medicine | Admitting: Internal Medicine

## 2023-02-28 ENCOUNTER — Other Ambulatory Visit: Payer: Self-pay | Admitting: Internal Medicine

## 2023-02-28 DIAGNOSIS — M25552 Pain in left hip: Secondary | ICD-10-CM

## 2023-03-06 DIAGNOSIS — M25552 Pain in left hip: Secondary | ICD-10-CM | POA: Diagnosis not present

## 2023-03-06 DIAGNOSIS — R278 Other lack of coordination: Secondary | ICD-10-CM | POA: Diagnosis not present

## 2023-03-06 DIAGNOSIS — M79652 Pain in left thigh: Secondary | ICD-10-CM | POA: Diagnosis not present

## 2023-03-08 DIAGNOSIS — M79652 Pain in left thigh: Secondary | ICD-10-CM | POA: Diagnosis not present

## 2023-03-08 DIAGNOSIS — L570 Actinic keratosis: Secondary | ICD-10-CM | POA: Diagnosis not present

## 2023-03-08 DIAGNOSIS — L821 Other seborrheic keratosis: Secondary | ICD-10-CM | POA: Diagnosis not present

## 2023-03-08 DIAGNOSIS — R278 Other lack of coordination: Secondary | ICD-10-CM | POA: Diagnosis not present

## 2023-03-08 DIAGNOSIS — D225 Melanocytic nevi of trunk: Secondary | ICD-10-CM | POA: Diagnosis not present

## 2023-03-08 DIAGNOSIS — D2261 Melanocytic nevi of right upper limb, including shoulder: Secondary | ICD-10-CM | POA: Diagnosis not present

## 2023-03-08 DIAGNOSIS — L245 Irritant contact dermatitis due to other chemical products: Secondary | ICD-10-CM | POA: Diagnosis not present

## 2023-03-08 DIAGNOSIS — M25552 Pain in left hip: Secondary | ICD-10-CM | POA: Diagnosis not present

## 2023-03-08 DIAGNOSIS — M7130 Other bursal cyst, unspecified site: Secondary | ICD-10-CM | POA: Diagnosis not present

## 2023-03-08 DIAGNOSIS — L218 Other seborrheic dermatitis: Secondary | ICD-10-CM | POA: Diagnosis not present

## 2023-03-22 DIAGNOSIS — M25552 Pain in left hip: Secondary | ICD-10-CM | POA: Diagnosis not present

## 2023-03-22 DIAGNOSIS — M79652 Pain in left thigh: Secondary | ICD-10-CM | POA: Diagnosis not present

## 2023-03-22 DIAGNOSIS — R278 Other lack of coordination: Secondary | ICD-10-CM | POA: Diagnosis not present

## 2023-03-27 DIAGNOSIS — R278 Other lack of coordination: Secondary | ICD-10-CM | POA: Diagnosis not present

## 2023-03-27 DIAGNOSIS — M79652 Pain in left thigh: Secondary | ICD-10-CM | POA: Diagnosis not present

## 2023-03-27 DIAGNOSIS — M25552 Pain in left hip: Secondary | ICD-10-CM | POA: Diagnosis not present

## 2023-03-28 ENCOUNTER — Other Ambulatory Visit: Payer: Self-pay | Admitting: Internal Medicine

## 2023-03-28 ENCOUNTER — Ambulatory Visit
Admission: RE | Admit: 2023-03-28 | Discharge: 2023-03-28 | Disposition: A | Discharge status: Home / Self Care | Payer: Medicare Other | Source: Ambulatory Visit | Attending: Internal Medicine | Admitting: Internal Medicine

## 2023-03-28 ENCOUNTER — Ambulatory Visit: Payer: Medicare Other | Admitting: Physician Assistant

## 2023-03-28 DIAGNOSIS — G8929 Other chronic pain: Secondary | ICD-10-CM | POA: Diagnosis not present

## 2023-03-28 DIAGNOSIS — M5440 Lumbago with sciatica, unspecified side: Secondary | ICD-10-CM

## 2023-03-28 DIAGNOSIS — M545 Low back pain, unspecified: Secondary | ICD-10-CM | POA: Diagnosis not present

## 2023-03-28 DIAGNOSIS — M19041 Primary osteoarthritis, right hand: Secondary | ICD-10-CM | POA: Diagnosis not present

## 2023-03-28 DIAGNOSIS — M19042 Primary osteoarthritis, left hand: Secondary | ICD-10-CM | POA: Diagnosis not present

## 2023-03-29 ENCOUNTER — Encounter: Payer: Self-pay | Admitting: Physician Assistant

## 2023-03-29 ENCOUNTER — Ambulatory Visit (INDEPENDENT_AMBULATORY_CARE_PROVIDER_SITE_OTHER): Payer: Medicare Other | Admitting: Physician Assistant

## 2023-03-29 VITALS — BP 149/72 | HR 45 | Ht 70.0 in | Wt 160.0 lb

## 2023-03-29 DIAGNOSIS — M25552 Pain in left hip: Secondary | ICD-10-CM | POA: Diagnosis not present

## 2023-03-29 DIAGNOSIS — G309 Alzheimer's disease, unspecified: Secondary | ICD-10-CM

## 2023-03-29 DIAGNOSIS — M79652 Pain in left thigh: Secondary | ICD-10-CM | POA: Diagnosis not present

## 2023-03-29 DIAGNOSIS — R278 Other lack of coordination: Secondary | ICD-10-CM | POA: Diagnosis not present

## 2023-03-29 DIAGNOSIS — F0670 Mild neurocognitive disorder due to known physiological condition without behavioral disturbance: Secondary | ICD-10-CM | POA: Diagnosis not present

## 2023-03-29 NOTE — Progress Notes (Signed)
Assessment/Plan:   Mild cognitive impairment, likely due to Alzheimer's disease  Victor Mcbride is a very pleasant 82 y.o. RH male presenting today in follow-up for evaluation of memory loss. Patient is on donepezil 10 mg daily. Personally reviewed of the brain from January 2022 was remarkable for mild diffuse atrophy and moderate chronic microvascular disease without acute findings. MMSE today is 26/30.  Memory is stable, he is able to participate in all ADLs.  He continues to drive without any issues.  He remains very active.  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  One of the side effects of donepezil is lowering the heart rate as well which would have to be taken into consideration.   Patient is asymptomatic for bradycardia.   Recommendations:   Follow up in 6 months, after his neuropsychological testing. Continue donepezil 10 mg daily Continue to monitor tremor without parkinsonian signs on exam today  Repeat neuropsychological testing for clarity of the diagnosis and disease trajectory, this is scheduled for October 2024  recommend good control of cardiovascular risk factors, as mentioned above, recommend discuss with PCP his blood pressure and rate control medications in view of bradycardia. Continue to control mood as per PCP    Subjective:   This patient is accompanied in the office by his wife who supplements the history. Previous records as well as any outside records available were reviewed prior to todays visit.   Patient was last seen on 10/07/2022.  Last MMSE on 04/05/2022 was 26/30.    Any changes in memory since last visit?  "Memory is about the same ". Occasionally he forgets the names of people that he just met.  He does forget recent  conversations per wife's report.  He uses tricks, such as checking information or visualizing them.He continues to do computer puzzles and reading extensively.   repeats oneself?  Endorsed, especially repeating stories. Disoriented when walking into a room?  Patient denies   Leaving objects in unusual places?  Patient denies   Wandering behavior?   denies   Any personality changes since last visit? denies, although he may get frustrated if he cannot remember a word. Any worsening depression?: denies   Hallucinations or paranoia?  denies   Seizures?   denies    Any sleep changes?   Goes to sleep around 9 pm til 7 am . Denies vivid dreams, REM behavior or sleepwalking   Sleep apnea?   denies   Any hygiene concerns?   denies   Independent of bathing and dressing?  Endorsed  Does the patient needs help with medications?  Patient is in charge  Who is in charge of the finances?  Wife is in charge    Any changes in appetite?  denies    Patient have trouble swallowing?  denies   Does the patient cook?  No  Any headaches?    denies   Vision changes? denies Chronic back pain  has some arthritis, followed by PCP. Doing PT for the last 3 weeks. He has been swimming regularly.  He is a former Building surveyor, and continues to swim extensively. Ambulates with difficulty? denies   Recent falls or head injuries?  denies     Unilateral weakness, numbness or tingling?  denies   Any tremors?  He has a history of bilateral hand tremor, left greater than right, but does not affect his ADLs, denies any other parkinsonian signs. Any anosmia?    denies   Any incontinence of urine?  denies   Any bowel dysfunction?  denies      Patient lives with wife   Does the patient drive? Endorsed.      History on Initial Assessment 12/07/2020: This is an 82 year old right-handed man with a history of hypertension, hyperlipidemia, presenting for evaluation of memory loss. He started reporting concerns in 2015, MMSE  30/30 at PCP office. Repeat MMSE in 01/2020 at Dr. Laverle Hobby office was 27/30. He and his wife wanted to start medication and he has been taking Donepezil 10mg  daily for the past 4-5 years without side effects.   He states he is having issues with his memory, he cannot remember where he put things. His wife started noticing changes with his short-term memory 7-8 years ago, he would repeat the same question and forget details of her answer. There has been a fairly slow progression but changes have been more pronounced over the past year or so. He continues to drive without getting lost, his wife denies any driving concerns. He manages his own medications without difficulties. His wife manages finances, he had been doing the bigger investments and taxes and can still do it. He keeps repeating that he has become more dependent on her to do these things. No word-finding difficulties. He is independent with dressing and bathing, no hygiene concerns. He has noticed more difficulties reading, he has a hard time keeping track of characters. 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 He states mood is generally good, his wife notes he gets more frustrated now when he cannot remember something. No paranoia or hallucinations.   He denies any headaches, dizziness, diplopia, dysarthria, dysphagia, neck/back pain, focal numbness/tingling/weakness, bowel/bladder dysfunction, anosmia, no falls. He has had hand tremors for many years. He sleeps fine, but sometimes wakes up a few times and thinks about something. He walks it off and goes back to sleep. He dozes off in the day sometimes. His wife notes snoring, no REM behavior disorder. His maternal grandmother had dementia in her 56s. He denies any significant head injuries. He and his wife split a bottle of wine every night, he drinks 1.5-2 oz of scotch sometimes. They moved to [place] 3 years ago.      Neuropsych evaluation 12/2020    Briefly, results suggested significant impairment surrounding retrieval and consolidation aspects of memory. An additional impairment was exhibited across semantic fluency, as well as an isolated line orientation task. Performance variability was exhibited across complex attention and response inhibition. Specific to memory, Mr. Winokur was largely amnestic across delayed memory trials and performed far below expectation across yes/no recognition trials. 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 In addition, his wife's report of the gradual decline of cognitive abilities over the course of several years is very consistent with this condition. While a noted weakness in semantic fluency relative to phonemic fluency is further consistent with this presentation, he did perform well across confrontation naming, executive functioning, and visuoconstructional tasks which suggests that this disease process remains in earlier stages if indeed present.    Past Medical History:  Diagnosis Date   Hyperlipidemia    Hypertension    Mild neurocognitive disorder due to  Alzheimer's disease, possible 12/22/2020     History reviewed. No pertinent surgical history.   PREVIOUS MEDICATIONS:   CURRENT MEDICATIONS:  Outpatient Encounter Medications as of 03/29/2023  Medication Sig   amLODIPine Besylate-Celecoxib 2.5-200 MG TABS Take by mouth.   atenolol (TENORMIN) 25 MG tablet Take 25 mg by mouth daily.   cholecalciferol (VITAMIN D3) 25 MCG (1000 UNIT) tablet Take 1,000 Units by mouth daily.   donepezil (ARICEPT) 10 MG tablet Take 10 mg by mouth daily.   loratadine (CLARITIN) 10 MG tablet Take 10 mg by mouth daily.   losartan (COZAAR) 50 MG tablet Take 50 mg by mouth daily.   Multiple Vitamin (MULTIVITAMIN) tablet Take 1 tablet by mouth daily.   Omega-3 Fatty Acids (FISH OIL) 1000 MG CAPS Take 1,000 mg by mouth daily.   omeprazole (PRILOSEC) 20 MG  capsule Take 20 mg by mouth 2 (two) times a week. Pt takes every Monday and Friday   simvastatin (ZOCOR) 10 MG tablet Take 10 mg by mouth daily.   [DISCONTINUED] aspirin 81 MG tablet Take 81 mg by mouth daily. (Patient not taking: Reported on 04/05/2022)   [DISCONTINUED] hydrochlorothiazide (MICROZIDE) 12.5 MG capsule Take 12.5 mg by mouth daily. (Patient not taking: Reported on 10/07/2022)   No facility-administered encounter medications on file as of 03/29/2023.     Objective:     PHYSICAL EXAMINATION:    VITALS:   Vitals:   03/29/23 1100  BP: (!) 149/72  Pulse: (!) 45  SpO2: 91%  Weight: 160 lb (72.6 kg)  Height: 5\' 10"  (1.778 m)    GEN:  The patient appears stated age and is in NAD. HEENT:  Normocephalic, atraumatic.   Neurological examination:  General: NAD, well-groomed, appears stated age. Orientation: The patient is alert. Oriented to person, place and not to year, but able to tell the month and date Cranial nerves: There is good facial symmetry.The speech is fluent and clear. No aphasia or dysarthria. Fund of knowledge is appropriate. Recent memory impaired and remote memory is normal.  Attention and concentration are normal.  Able to name objects and repeat phrases.  Hearing is intact to conversational tone.   Delayed recall 0/3 Sensation: Sensation is intact to light touch throughout Motor: Strength is at least antigravity x4. Tremors: Mild left greater than right tremor DTR's 2/4 in UE/LE      12/07/2020   10:00 AM  Montreal Cognitive Assessment   Visuospatial/ Executive (0/5) 3  Naming (0/3) 3  Attention: Read list of digits (0/2) 2  Attention: Read list of letters (0/1) 1  Attention: Serial 7 subtraction starting at 100 (0/3) 3  Language: Repeat phrase (0/2) 2  Language : Fluency (0/1) 0  Abstraction (0/2) 2  Delayed Recall (0/5) 0  Orientation (0/6) 4  Total 20       03/29/2023   12:00 PM 04/05/2022   12:00 PM 08/16/2021   12:00 PM  MMSE - Mini Mental  State Exam  Orientation to time 4 5 3   Orientation to Place 5 4 5   Registration 3 3 3   Attention/ Calculation 5 5 5   Recall 0 0 0  Language- name 2 objects 2 2 2   Language- repeat 1 1 1   Language- follow 3 step command 3 3 3   Language- read & follow direction 1 1 1   Write a sentence 1 1 1   Copy design 1 1 1   Total score 26 26 25        Movement examination: Tone: There is  normal tone in the UE/LE, no cogwheeling. Abnormal movements:   No myoclonus.  No asterixis.   Coordination:  There is no decremation with RAM's. Normal finger to nose  Gait and Station: The patient has no difficulty arising out of a deep-seated chair without the use of the hands. The patient's stride length is good.  Gait is cautious and narrow.   Thank you for allowing [place] the opportunity to participate in the care of this nice patient. Please do not hesitate to contact us for any questions or concerns.   Total time spent on today's visit was 35 minutes dedicated to this patient today, preparing to see patient, examining the patient, ordering tests and/or medications and counseling the patient, documenting clinical information in the EHR or other health record, independently interpreting results and communicating results to the patient/family, discussing treatment and goals, answering patient's questions and coordinating care.  Cc:  Thana Ates, MD  Marlowe Kays 03/29/2023 12:09 PM

## 2023-03-29 NOTE — Patient Instructions (Addendum)
Good to see you!  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  Recommend repeat neuropsych evaluation later in 2024  3    Follow-up in 6 months    FALL PRECAUTIONS: Be cautious when walking. Scan the area for obstacles that may increase the risk of trips and falls. When getting up in the mornings, sit up at the edge of the bed for a few minutes before getting out of bed. Consider elevating the bed at the head end to avoid drop of blood pressure when getting up. Walk always in a well-lit room (use night lights in the walls). Avoid area rugs or power cords from appliances in the middle of the walkways. Use a walker or a cane if necessary and consider physical therapy for balance exercise. Get your eyesight checked regularly.  FINANCIAL OVERSIGHT: Supervision, especially oversight when making financial decisions or transactions is also recommended.  HOME SAFETY: Consider the safety of the kitchen when operating appliances like stoves, microwave oven, and blender. Consider having supervision and share cooking responsibilities until no longer able to participate in those. Accidents with firearms and other hazards in the house should be identified and addressed as well.  DRIVING: Regarding driving, in patients with progressive memory problems, driving will be impaired. We advise to have someone else do the driving if trouble finding directions or if minor accidents are reported. Independent driving assessment is available to determine safety of driving.  ABILITY TO BE LEFT ALONE: If patient is unable to contact 911 operator, consider using LifeLine, or when the need is there, arrange for someone to stay with patients. Smoking is a fire hazard, consider supervision or cessation. Risk of wandering should be assessed by caregiver and if detected at any  point, supervision and safe proof recommendations should be instituted.  MEDICATION SUPERVISION: Inability to self-administer medication needs to be constantly addressed. Implement a mechanism to ensure safe administration of the medications.  RECOMMENDATIONS FOR ALL PATIENTS WITH MEMORY PROBLEMS: 1. Continue to exercise (Recommend 30 minutes of walking everyday, or 3 hours every week) 2. Increase social interactions - continue going to [place] and enjoy social gatherings with friends and family 3. Eat healthy, avoid fried foods and eat more fruits and vegetables 4. Maintain adequate blood pressure, blood sugar, and blood cholesterol level. Reducing the risk of stroke and cardiovascular disease also helps promoting better memory. 5. Avoid stressful situations. Live a simple life and avoid aggravations. Organize your time and prepare for the next day in anticipation. 6. Sleep well, avoid any interruptions of sleep and avoid any distractions in the bedroom that may interfere with adequate sleep quality 7. Avoid sugar, avoid sweets as there is a strong link between excessive sugar intake, diabetes, and cognitive impairment We discussed the Mediterranean diet, which has been shown to help patients reduce the risk of progressive memory disorders and reduces cardiovascular risk. This includes eating fish, eat fruits and green leafy vegetables, nuts like almonds and hazelnuts, walnuts, and also use olive oil. Avoid fast foods and fried foods as much as possible. Avoid sweets and sugar as sugar use has been linked to worsening of memory function.  There is always a concern of gradual progression of memory problems. If this is the case, then we may need to adjust level of care according to patient needs. Support, both to the  patient and caregiver, should then be put into place.

## 2023-04-03 DIAGNOSIS — R278 Other lack of coordination: Secondary | ICD-10-CM | POA: Diagnosis not present

## 2023-04-03 DIAGNOSIS — M25552 Pain in left hip: Secondary | ICD-10-CM | POA: Diagnosis not present

## 2023-04-03 DIAGNOSIS — M79652 Pain in left thigh: Secondary | ICD-10-CM | POA: Diagnosis not present

## 2023-04-05 DIAGNOSIS — M79652 Pain in left thigh: Secondary | ICD-10-CM | POA: Diagnosis not present

## 2023-04-05 DIAGNOSIS — R278 Other lack of coordination: Secondary | ICD-10-CM | POA: Diagnosis not present

## 2023-04-05 DIAGNOSIS — M25552 Pain in left hip: Secondary | ICD-10-CM | POA: Diagnosis not present

## 2023-04-10 DIAGNOSIS — M79652 Pain in left thigh: Secondary | ICD-10-CM | POA: Diagnosis not present

## 2023-04-10 DIAGNOSIS — M25552 Pain in left hip: Secondary | ICD-10-CM | POA: Diagnosis not present

## 2023-04-10 DIAGNOSIS — R278 Other lack of coordination: Secondary | ICD-10-CM | POA: Diagnosis not present

## 2023-04-12 DIAGNOSIS — R278 Other lack of coordination: Secondary | ICD-10-CM | POA: Diagnosis not present

## 2023-04-12 DIAGNOSIS — M79652 Pain in left thigh: Secondary | ICD-10-CM | POA: Diagnosis not present

## 2023-04-12 DIAGNOSIS — M25552 Pain in left hip: Secondary | ICD-10-CM | POA: Diagnosis not present

## 2023-04-18 DIAGNOSIS — M25552 Pain in left hip: Secondary | ICD-10-CM | POA: Diagnosis not present

## 2023-04-18 DIAGNOSIS — R278 Other lack of coordination: Secondary | ICD-10-CM | POA: Diagnosis not present

## 2023-04-18 DIAGNOSIS — M79652 Pain in left thigh: Secondary | ICD-10-CM | POA: Diagnosis not present

## 2023-04-19 DIAGNOSIS — M25552 Pain in left hip: Secondary | ICD-10-CM | POA: Diagnosis not present

## 2023-04-19 DIAGNOSIS — M79652 Pain in left thigh: Secondary | ICD-10-CM | POA: Diagnosis not present

## 2023-04-19 DIAGNOSIS — R278 Other lack of coordination: Secondary | ICD-10-CM | POA: Diagnosis not present

## 2023-05-22 DIAGNOSIS — Z79899 Other long term (current) drug therapy: Secondary | ICD-10-CM | POA: Diagnosis not present

## 2023-05-22 DIAGNOSIS — G250 Essential tremor: Secondary | ICD-10-CM | POA: Diagnosis not present

## 2023-05-22 DIAGNOSIS — E7800 Pure hypercholesterolemia, unspecified: Secondary | ICD-10-CM | POA: Diagnosis not present

## 2023-05-22 DIAGNOSIS — M109 Gout, unspecified: Secondary | ICD-10-CM | POA: Diagnosis not present

## 2023-05-22 DIAGNOSIS — F02A0 Dementia in other diseases classified elsewhere, mild, without behavioral disturbance, psychotic disturbance, mood disturbance, and anxiety: Secondary | ICD-10-CM | POA: Diagnosis not present

## 2023-05-22 DIAGNOSIS — Z0000 Encounter for general adult medical examination without abnormal findings: Secondary | ICD-10-CM | POA: Diagnosis not present

## 2023-05-22 DIAGNOSIS — Z1331 Encounter for screening for depression: Secondary | ICD-10-CM | POA: Diagnosis not present

## 2023-05-22 DIAGNOSIS — M19041 Primary osteoarthritis, right hand: Secondary | ICD-10-CM | POA: Diagnosis not present

## 2023-05-22 DIAGNOSIS — M545 Low back pain, unspecified: Secondary | ICD-10-CM | POA: Diagnosis not present

## 2023-05-22 DIAGNOSIS — I10 Essential (primary) hypertension: Secondary | ICD-10-CM | POA: Diagnosis not present

## 2023-05-22 DIAGNOSIS — G301 Alzheimer's disease with late onset: Secondary | ICD-10-CM | POA: Diagnosis not present

## 2023-07-12 DIAGNOSIS — T148XXA Other injury of unspecified body region, initial encounter: Secondary | ICD-10-CM | POA: Diagnosis not present

## 2023-07-12 DIAGNOSIS — S4992XA Unspecified injury of left shoulder and upper arm, initial encounter: Secondary | ICD-10-CM | POA: Diagnosis not present

## 2023-07-12 DIAGNOSIS — S298XXA Other specified injuries of thorax, initial encounter: Secondary | ICD-10-CM | POA: Diagnosis not present

## 2023-07-12 DIAGNOSIS — S199XXA Unspecified injury of neck, initial encounter: Secondary | ICD-10-CM | POA: Diagnosis not present

## 2023-07-12 DIAGNOSIS — S42002A Fracture of unspecified part of left clavicle, initial encounter for closed fracture: Secondary | ICD-10-CM | POA: Diagnosis not present

## 2023-07-12 DIAGNOSIS — S098XXA Other specified injuries of head, initial encounter: Secondary | ICD-10-CM | POA: Diagnosis not present

## 2023-07-12 DIAGNOSIS — S79822A Other specified injuries of left thigh, initial encounter: Secondary | ICD-10-CM | POA: Diagnosis not present

## 2023-07-12 DIAGNOSIS — S0101XA Laceration without foreign body of scalp, initial encounter: Secondary | ICD-10-CM | POA: Diagnosis not present

## 2023-07-12 DIAGNOSIS — S3993XA Unspecified injury of pelvis, initial encounter: Secondary | ICD-10-CM | POA: Diagnosis not present

## 2023-07-12 DIAGNOSIS — S0990XA Unspecified injury of head, initial encounter: Secondary | ICD-10-CM | POA: Diagnosis not present

## 2023-07-21 DIAGNOSIS — S42002D Fracture of unspecified part of left clavicle, subsequent encounter for fracture with routine healing: Secondary | ICD-10-CM | POA: Diagnosis not present

## 2023-07-21 DIAGNOSIS — S42002A Fracture of unspecified part of left clavicle, initial encounter for closed fracture: Secondary | ICD-10-CM | POA: Diagnosis not present

## 2023-07-21 DIAGNOSIS — Z4802 Encounter for removal of sutures: Secondary | ICD-10-CM | POA: Diagnosis not present

## 2023-07-21 DIAGNOSIS — Z79899 Other long term (current) drug therapy: Secondary | ICD-10-CM | POA: Diagnosis not present

## 2023-07-21 DIAGNOSIS — S0101XD Laceration without foreign body of scalp, subsequent encounter: Secondary | ICD-10-CM | POA: Diagnosis not present

## 2023-07-21 DIAGNOSIS — X58XXXA Exposure to other specified factors, initial encounter: Secondary | ICD-10-CM | POA: Diagnosis not present

## 2023-07-24 DIAGNOSIS — M25512 Pain in left shoulder: Secondary | ICD-10-CM | POA: Diagnosis not present

## 2023-07-26 ENCOUNTER — Other Ambulatory Visit: Payer: Self-pay | Admitting: Specialist

## 2023-07-26 ENCOUNTER — Ambulatory Visit
Admission: RE | Admit: 2023-07-26 | Discharge: 2023-07-26 | Disposition: A | Discharge status: Home / Self Care | Payer: Medicare Other | Source: Ambulatory Visit | Attending: Specialist | Admitting: Specialist

## 2023-07-26 DIAGNOSIS — M25512 Pain in left shoulder: Secondary | ICD-10-CM

## 2023-07-26 DIAGNOSIS — S42002A Fracture of unspecified part of left clavicle, initial encounter for closed fracture: Secondary | ICD-10-CM | POA: Diagnosis not present

## 2023-07-26 DIAGNOSIS — I7121 Aneurysm of the ascending aorta, without rupture: Secondary | ICD-10-CM | POA: Diagnosis not present

## 2023-07-26 DIAGNOSIS — S2242XA Multiple fractures of ribs, left side, initial encounter for closed fracture: Secondary | ICD-10-CM | POA: Diagnosis not present

## 2023-07-26 DIAGNOSIS — R079 Chest pain, unspecified: Secondary | ICD-10-CM | POA: Diagnosis not present

## 2023-08-01 ENCOUNTER — Telehealth: Payer: Self-pay

## 2023-08-21 NOTE — Progress Notes (Unsigned)
PCP is Thana Ates, MD Referring Provider is Thana Ates, MD  Reason for consult: Evaluation of thoracic aortic aneurysm   HPI: [name]. Victor Mcbride is an 82 year old gentleman with a past history notable for hypertension, dyslipidemia, and mild Alzheimer's dementia.  He recently fell and about 3 weeks later presented for a CT scan for evaluation of persistent chest pain.  This study demonstrated recent fractures of ribs 7 through 10 along with fracture of the medial left clavicle.  Incidentally noted was a 4.2 cm thoracic aortic aneurysm.  There were no previous CT scans available for comparison.  [name] was referred to [place] for evaluation of the aneurysm.   [name] denies having any chest pain or shortness of breath.  He has no family history of aneurysmal disease.   Past Medical History:  Diagnosis Date   Hyperlipidemia    Hypertension    Mild neurocognitive disorder due to Alzheimer's disease, possible 12/22/2020    No past surgical history on file.  Family History  Problem Relation Age of Onset   Breast cancer Mother    Heart defect Father    Dementia Maternal Grandmother     Social History Social History   Tobacco Use   Smoking status: Never   Smokeless tobacco: Never  Vaping Use   Vaping status: Never Used  Substance Use Topics   Alcohol use: Yes    Comment: 1/2 bottle of wine nightly   Drug use: No    Current Outpatient Medications  Medication Sig Dispense Refill   amLODIPine Besylate-Celecoxib 2.5-200 MG TABS Take by mouth.     atenolol (TENORMIN) 25 MG tablet Take 25 mg by mouth daily.     cholecalciferol (VITAMIN D3) 25 MCG (1000 UNIT) tablet Take 1,000 Units by mouth daily.     donepezil (ARICEPT) 10 MG tablet Take 10 mg by mouth daily.     loratadine (CLARITIN) 10 MG tablet Take 10 mg by mouth daily.     losartan (COZAAR) 50 MG tablet Take 50 mg by mouth daily.     Multiple Vitamin (MULTIVITAMIN) tablet Take 1 tablet by mouth daily.     Omega-3  Fatty Acids (FISH OIL) 1000 MG CAPS Take 1,000 mg by mouth daily.     omeprazole (PRILOSEC) 20 MG capsule Take 20 mg by mouth 2 (two) times a week. Pt takes every Monday and Friday     simvastatin (ZOCOR) 10 MG tablet Take 10 mg by mouth daily.     No current facility-administered medications for this visit.    No Known Allergies  Review of Systems  Constitutional:  Positive for weight loss.  HENT: Negative.    Eyes:        Wears glasses  Respiratory: Negative.    Cardiovascular: Negative.   Gastrointestinal: Negative.   Genitourinary: Negative.   Musculoskeletal:  Positive for joint pain.       History of arthritis  Skin: Negative.   Neurological:        Early Alzheimer's  Endo/Heme/Allergies: Negative.   Psychiatric/Behavioral:  Positive for memory loss.      Physical Exam  Vital signs BP 177/73 Pulse 72 Respirations 18 SpO2 96% on room air  General: Very pleasant 82 year old gentleman in no distress.  He is accompanied by his wife.  He was noted to repeat himself several times during conversation but accurately described the fall last month leading to the way of an clavicular fractures. Neck: No carotid bruit.  No JVD Chest:  Breath sounds are full, equal and clear to auscultation Heart: Regular rate and rhythm, no murmur Extremities: Left arm is in a sling.  There is obvious deformity of the left clavicle from recent fracture.  No peripheral edema Neuro: Early dementia is evident during conversation with [name].  He has no focal motor or sensory deficits.     Diagnostic Tests: Narrative & Impression  CLINICAL DATA:  Chest pain after fall 3 weeks ago.   EXAM: CT CHEST WITHOUT CONTRAST   TECHNIQUE: Multidetector CT imaging of the chest was performed following the standard protocol without IV contrast.   RADIATION DOSE REDUCTION: This exam was performed according to the departmental dose-optimization program which includes automated exposure control,  adjustment of the mA and/or kV according to patient size and/or use of iterative reconstruction technique.   COMPARISON:  None Available.   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 Normal cardiac size. No pericardial effusion. Coronary artery calcifications are noted.   Mediastinum/Nodes: No enlarged mediastinal or axillary lymph nodes. Thyroid gland, trachea, and esophagus demonstrate no significant findings.   Lungs/Pleura: No pneumothorax or pleural effusion is noted. Mild bilateral posterior basilar subsegmental atelectasis is noted.   Upper Abdomen: No acute abnormality.   Musculoskeletal: Nondisplaced fracture is seen involving anterior portion of left seventh rib. Mildly displaced fractures are seen involving the left eighth, ninth, and tenth ribs. Moderately displaced fracture is seen involving the medial portion of the left clavicle near left sternoclavicular joint.   IMPRESSION: Acute left seventh, eighth, ninth and tenth rib fractures. Moderately displaced fracture is seen involving medial portion of left clavicle.   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 Recommend annual imaging followup by CTA or MRA. This recommendation follows 2010 ACCF/AHA/AATS/ACR/ASA/SCA/SCAI/SIR/STS/SVM Guidelines for the Diagnosis and Management of Patients with Thoracic Aortic Disease. Circulation. 2010; 121: V425-Z563. Aortic aneurysm NOS (ICD10-I71.9).   Coronary artery calcifications are noted.   Aortic Atherosclerosis (ICD10-I70.0).     Electronically Signed   By: Lupita Raider M.D.   On: 07/26/2023 10:08    Impression / Plan: 4.2 cm thoracic arctic aneurysm incidentally noted on CT scan obtained recently following a fall that also demonstrated multiple left rib fractures and a left clavicular fracture.  He is currently being managed by Dr. Tinnie Gens and he was orthopedic injuries.   There is currently no indication for surgical intervention of  the thoracic aneurysm.  We discussed the recommendation for annual surveillance and the importance of careful blood pressure control.  He is already taking a statin.  We discussed avoiding strenuous activity.  Plan follow-up in about 1 year with CTA chest.  Leary Roca, PA-C [facility] [phone]

## 2023-08-22 ENCOUNTER — Institutional Professional Consult (permissible substitution) (INDEPENDENT_AMBULATORY_CARE_PROVIDER_SITE_OTHER): Payer: Medicare Other | Admitting: Physician Assistant

## 2023-08-22 ENCOUNTER — Encounter: Payer: Self-pay | Admitting: Physician Assistant

## 2023-08-22 VITALS — BP 160/68 | HR 72 | Resp 18 | Ht 70.0 in | Wt 153.0 lb

## 2023-08-22 DIAGNOSIS — I7121 Aneurysm of the ascending aorta, without rupture: Secondary | ICD-10-CM

## 2023-08-22 DIAGNOSIS — S2242XA Multiple fractures of ribs, left side, initial encounter for closed fracture: Secondary | ICD-10-CM | POA: Diagnosis not present

## 2023-08-22 NOTE — Patient Instructions (Signed)
Avoid strenuous activity with no lifting greater than 30 pounds  Continue to keep a close watch on blood pressure with a goal of less than 130/90  Continue your Zocor as you are taking for cholesterol management  Avoid the quinolone class of antibiotics like Cipro and Levaquin  Follow-up about 1 year with CTA chest.

## 2023-08-24 DIAGNOSIS — I719 Aortic aneurysm of unspecified site, without rupture: Secondary | ICD-10-CM | POA: Diagnosis not present

## 2023-08-24 DIAGNOSIS — M25512 Pain in left shoulder: Secondary | ICD-10-CM | POA: Diagnosis not present

## 2023-08-24 DIAGNOSIS — S42002D Fracture of unspecified part of left clavicle, subsequent encounter for fracture with routine healing: Secondary | ICD-10-CM | POA: Diagnosis not present

## 2023-08-24 DIAGNOSIS — I10 Essential (primary) hypertension: Secondary | ICD-10-CM | POA: Diagnosis not present

## 2023-09-25 ENCOUNTER — Encounter: Payer: Medicare Other | Admitting: Psychology

## 2023-09-27 ENCOUNTER — Encounter: Payer: Self-pay | Admitting: Psychology

## 2023-09-27 ENCOUNTER — Ambulatory Visit: Payer: Medicare Other | Admitting: Psychology

## 2023-09-27 DIAGNOSIS — G250 Essential tremor: Secondary | ICD-10-CM | POA: Insufficient documentation

## 2023-09-27 DIAGNOSIS — I679 Cerebrovascular disease, unspecified: Secondary | ICD-10-CM

## 2023-09-27 DIAGNOSIS — R4189 Other symptoms and signs involving cognitive functions and awareness: Secondary | ICD-10-CM

## 2023-09-27 DIAGNOSIS — G3184 Mild cognitive impairment, so stated: Secondary | ICD-10-CM

## 2023-09-27 DIAGNOSIS — I6381 Other cerebral infarction due to occlusion or stenosis of small artery: Secondary | ICD-10-CM

## 2023-09-27 NOTE — Progress Notes (Unsigned)
NEUROPSYCHOLOGICAL EVALUATION [place]. [facility] Department of Neurology  Date of Evaluation: September 27, 2023  Reason for Referral:   KJON GERMANI is a 82 y.o. right-handed Caucasian male referred by Marlowe Kays, PA-C, to characterize his current cognitive functioning and assist with diagnostic clarity and treatment planning in the context of a prior mild neurocognitive disorder diagnosis and concern for progressive cognitive decline.   Assessment and Plan:   Clinical Impression(s): Mr. Domond pattern of performance is suggestive of severe impairment surrounding all aspects of learning and memory. An additional impairment was exhibited across semantic fluency, while performance variability was exhibited across processing speed and complex attention. Variability was also noted across a response inhibition task; however, performances across all other executive functioning tasks were appropriate. Performances were also appropriate relative to age-matched peers across basic attention, receptive language, phonemic fluency, confrontation naming, and visuospatial abilities. Mr. Blandin continues to deny difficulties completing instrumental activities of daily living (ADLs) independently. His wife was in agreement. 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  Relative to his previous evaluation in January 2022, decline could be argued across processing speed, complex attention, and aspects of verbal memory. However, in many cases decline is quite subtle. Notable improvements were exhibited across visuospatial tasks. All other domains exhibited relative stability.   Regarding etiology, I continue to have primary concerns surrounding underlying Alzheimer's disease. Across memory testing, Mr. Taul was fully amnestic (i.e., 0%  retention) across all memory tasks after brief delays and generally performed poorly across yes/no recognition trials. This suggests evidence for rapid forgetting and an evolving and already quite severe storage impairment, both of which are the hallmark testing patterns of this illness. Further impairment surrounding semantic fluency would follow typical disease progression. His degree of alcohol consumption may be playing a role in exacerbating memory dysfunction and overall cognitive decline. 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 His degree of stability over time is encouraging and would suggest a slow rate of change thus far. Continued medical monitoring will be important moving forward.   Recommendations: A repeat neuropsychological evaluation in 12-24 months (sooner if functional decline is noted) is recommended to assess the trajectory of future cognitive decline should it occur. This will also aid in future efforts towards improved diagnostic clarity.  Mr. Rindlisbacher has already been prescribed a medication aimed to address memory loss and concerns surrounding Alzheimer's disease (i.e., donepezil/Aricept). He is encouraged to continue taking this medication as prescribed. It is important to highlight that this medication has been shown to slow functional decline in some individuals. There is no current treatment which can stop or reverse cognitive decline when caused by a neurodegenerative illness.   For men, the CDC defines "heavy drinking" as 15 or more alcoholic beverages per week. Mr. Cappo is very likely exceeding this value, potentially to a quite significant degree. Chronic heavy drinking will worsen memory and other thinking abilities and can increase the speed of decline in cases of Alzheimer's disease. He is encouraged to  significantly diminish his daily alcohol intake.   Performance across neurocognitive testing is not a strong predictor of an individual's safety operating a motor vehicle. Should his family wish to pursue a formalized driving evaluation, they could reach out to the following  agencies: [employer] [facility] in [place]: [phone] [facility]: [phone] [facility]: [phone] [facility]: [phone] or [phone]  Should there be progression of current deficits over time, Mr. Basciano is unlikely to regain any independent living skills lost. Therefore, it is recommended that he remain as involved as possible in all aspects of household chores, finances, and medication management, with supervision to ensure adequate performance. He will likely benefit from the establishment and maintenance of a routine in order to maximize his functional abilities over time.  It will be important for Mr. Raygor to have another person with him when in situations where he may need to process information, weigh the pros and cons of different options, and make decisions, in order to ensure that he fully understands and recalls all information to be considered.  If not already done, Mr. Bensinger and his family may want to discuss his wishes regarding durable power of attorney and medical decision making, so that he can have input into these choices. If they require legal assistance with this, long-term care resource access, or other aspects of estate planning, they could reach out to [employer] at [phone] for a free consultation. Additionally, they may wish to discuss future plans for caretaking and seek out community options for in home/residential care should they become necessary.  Mr. Rando is encouraged to attend to lifestyle factors for brain health (e.g., regular physical exercise, good nutrition habits and consideration of the MIND-DASH diet, regular  participation in cognitively-stimulating activities, and general stress management techniques), which are likely to have benefits for both emotional adjustment and cognition. Optimal control of vascular risk factors (including safe cardiovascular exercise and adherence to dietary recommendations) is encouraged. Continued participation in activities which provide mental stimulation and social interaction is also recommended.   Important information should be provided to Mr. Casamento in written format in all instances. This information should be placed in a highly frequented and easily visible location within his home to promote recall. External strategies such as written notes in a consistently used memory journal, visual and nonverbal auditory cues such as a calendar on the refrigerator or appointments with alarm, such as on a cell phone, can also help maximize recall.  To address problems with processing speed, he may wish to consider:   -Ensuring that he is alerted when essential material or instructions are being presented   -Adjusting the speed at which new information is presented   -Allowing for more time in comprehending, processing, and responding in conversation   -Repeating and paraphrasing instructions or conversations aloud  To address problems with fluctuating attention and/or executive dysfunction, he may wish to consider:   -Avoiding external distractions when needing to concentrate   -Limiting exposure to fast paced environments with multiple sensory demands   -Writing down complicated information and using checklists   -Attempting and completing one task at a time (i.e., no multi-tasking)   -Verbalizing aloud each step of a task to maintain focus   -Taking frequent breaks during the completion of steps/tasks to avoid fatigue   -Reducing the amount of information considered at one time   -Scheduling more difficult activities for a time of day where he is usually most alert  Review of  Records:   Mr. Harness was seen by [facility] Marland KitchenPatrcia Dolly, M.D.) on 12/07/2020 for an evaluation of memory loss. He reported trouble with misplacing objects. His wife started noticing changes with short-term memory 7-8 years ago where he would repeat the  same question and forget details of her answer. There has been a fairly slow progression but changes have been more pronounced over the past year or so. He continues to drive without getting lost and his wife denied any driving concerns. He manages his own medications without difficulties. His wife manages finances; however he had been doing the bigger investments and taxes without noted issue. He did repeat that he has become more dependent on her to perform these actions. He described his mood as generally good. His wife noted that he gets more frustrated when he cannot remember something. He denied ongoing paranoia, hallucinations, headaches, dizziness, diplopia, dysarthria, dysphagia, neck/back pain, focal numbness/tingling/weakness, bowel/bladder dysfunction, anosmia, or recent falls. He has had hand tremors for many years. He sleeps fine but will sometimes wake a few times throughout the night. He generally walks it off and goes back to sleep. No REM sleep behaviors were noted. He and his wife split a bottle of wine every night; he also sometimes drinks 1.5-2 oz of scotch in addition to this. Per records, memory concerns were first noted in 2015. Performance on a brief cognitive screening instrument (MMSE) was 30/30 at that time. Repeat MMSE in March 2021 was 27/30. Performance on a different screening instrument [facility]) with Dr. Karel Jarvis during that appointment was 20/30. Ultimately, Mr. Laso was referred for a comprehensive neuropsychological evaluation to characterize his cognitive abilities and to assist with diagnostic clarity and treatment planning.   He completed a comprehensive neuropsychological evaluation with myself on 12/22/2020. Results  suggested significant impairment surrounding retrieval and consolidation aspects of memory. An additional impairment was exhibited across semantic fluency, as well as an isolated line orientation task. Performance variability was exhibited across complex attention and response inhibition. Performance was appropriate across processing speed, basic attention, cognitive flexibility, verbal reasoning, safety/judgment, receptive language, phonemic fluency, confrontation naming, and visuoconstructional abilities. ADL dysfunction was denied and he was diagnosed with a mild neurocognitive disorder. No neuroimaging was available to review at that time. Concerns were expressed for underlying Alzheimer's disease and repeat testing was recommended.  Mr. Gammell most recently met with [facility]rology Marlowe Kays, [place]) on 03/29/2023 for follow-up. Cognition was described as stable and he continued to deny ADL dysfunction. Performance on a brief cognitive screening instrument on 04/05/2022 (MMSE) was 26/30. Ultimately, Mr. Mellies was referred for a comprehensive neuropsychological evaluation to characterize his cognitive abilities and to assist with diagnostic clarity and treatment planning.   Neuroimaging Brain MRI on 12/25/2020 revealed moderate chronic microvascular ischemic changes, as well as chronic lacunar infarcts involving the corona radiata, basal ganglia, midbrain, and left cerebellum.  Past Medical History:  Diagnosis Date   Cerebrovascular disease 2022   Moderate per MRI    Essential tremor    Hyperlipidemia    Hypertension    Mild neurocognitive disorder due to Alzheimer's disease, possible 12/22/2020   Multiple lacunar infarcts 2022   2022 MRI - Chronic lacunar insults involving the corona radiata, basal ganglia, midbrain, and left cerebellum    No past surgical history on file.   Current Outpatient Medications:    amLODIPine Besylate-Celecoxib 2.5-200 MG TABS, Take by mouth., Disp: , Rfl:     atenolol (TENORMIN) 25 MG tablet, Take 25 mg by mouth daily., Disp: , Rfl:    cholecalciferol (VITAMIN D3) 25 MCG (1000 UNIT) tablet, Take 1,000 Units by mouth daily., Disp: , Rfl:    donepezil (ARICEPT) 10 MG tablet, Take 10 mg by mouth daily., Disp: , Rfl:    loratadine (  CLARITIN) 10 MG tablet, Take 10 mg by mouth daily., Disp: , Rfl:    losartan (COZAAR) 50 MG tablet, Take 50 mg by mouth daily., Disp: , Rfl:    Multiple Vitamin (MULTIVITAMIN) tablet, Take 1 tablet by mouth daily., Disp: , Rfl:    Omega-3 Fatty Acids (FISH OIL) 1000 MG CAPS, Take 1,000 mg by mouth daily., Disp: , Rfl:    omeprazole (PRILOSEC) 20 MG capsule, Take 20 mg by mouth 2 (two) times a week. Pt takes every Monday and Friday, Disp: , Rfl:    simvastatin (ZOCOR) 10 MG tablet, Take 10 mg by mouth daily., Disp: , Rfl:   Clinical Interview:   The following information was obtained during a clinical interview with Mr. Stetter and his wife prior to cognitive testing.  Cognitive Symptoms: Decreased short-term memory: Endorsed. He previously reported trouble misplacing things around his residence. His wife noted him having trouble recalling the details of conversations and occasional trouble recalling the names of familiar individuals. Currently, Mr. Hogan denied progressive of deficits relative to his January 2022 evaluation. His wife did express her concerns surrounding more quick rapid forgetting, greater repetition in conversation, and overall progression of deficits. Per his wife, deficits were said to have started around 2015 and have gradually worsened over time.  Decreased long-term memory: Denied. Decreased attention/concentration: Endorsed. He was previously noted to frequently repeat the sentiment that he does not pay attention to things that do not interest him. This repetition was noted when asked both about attention/concentration issues, as well as ongoing memory dysfunction. This behavior persisted during the current  evaluation. However, he did eventually deny his perception of changes surrounding attention/concentration more broadly. Reduced processing speed: Denied. Difficulties with executive functions: Denied. Trouble with impulsivity and the presence of overt personality changes were also denied.  Difficulties with emotion regulation: Denied. Difficulties with receptive language: Denied. Difficulties with word finding: Denied. Decreased visuoperceptual ability: Denied.   Difficulties completing ADLs: Denied. His wife manages personal finances which is longstanding in nature. He continues to manage medications without issue. With that being said, he repeatedly described his wife's strong organizational abilities, stating that he greatly benefits from them. No driving concerns were noted.   Additional Medical History: History of traumatic brain injury/concussion: He was recently seen in the ED on 07/12/2023 following a fall resulting in several orthopedic injuries/fractures. He reported falling while attempting to carry objects up some steps. He noted a positive head impact and noted receiving several staples in his head to close a wound. He denied a loss in consciousness and both he and his wife denied any observable change in his cognition following this event. No other head injuries were described.  History of stroke: Denied. History of seizure activity: Denied. When previously asked this question, his wife expressed concern surrounding potential seizure activity. However, she then described what appeared to be syncopal episodes later found to be due to dehydration. History of known exposure to toxins: Denied. Symptoms of chronic pain: Denied. Experience of frequent headaches/migraines: Denied. Frequent instances of dizziness/vertigo: Denied.   Sensory changes: He wears glasses with positive effect. Other sensory changes/difficulties (e.g., hearing, taste, and smell) were denied.  Balance/coordination  difficulties: Denied. Outside of his recent fall, no consistent balance concerns were reported.  Other motor difficulties: Endorsed. He reported occasional instances of mild upper extremity tremors, largely when performing fine motor activities (e.g., picking up a spoon). He commented that he has been diagnosed with essential tremor in the past and that symptoms  have remained stable over time.  Sleep History: Estimated hours obtained each night: 7-8 hours.  Difficulties falling asleep: Denied. Difficulties staying asleep: Endorsed. He reported waking throughout the night to use the restroom. Occasionally, he reported that thoughts will enter his mind which may prevent him from falling back asleep. He previously described walking around his residence while sorting through the problem in his mind. After this is complete, he is largely able to go back asleep reasonably quickly.  Feels rested and refreshed upon awakening: Endorsed. However, he also remarked feeling as though he needs to get more sleep throughout the night.    History of snoring: Denied. His wife previously described that these behaviors were becoming far less pronounced over recent years. He reported that these behaviors were due to low humidity levels in their residence and that he was having to wake up and use a saline solution in his nose.  History of waking up gasping for air: Denied. Witnessed breath cessation while asleep: Denied.   History of vivid dreaming: Denied. Excessive movement while asleep: Denied. Instances of acting out his dreams: Denied.  Psychiatric/Behavioral Health History: Depression: He described his current mood as "good." His wife previously noted increased irritability and frustration, especially when he is faced with memory dysfunction in his day-to-day life. He denied to his knowledge being formally diagnosed with a mental health condition in the past. Current or remote suicidal ideation, intent, or plan  was denied.  Anxiety: Denied. Mania: Denied. Trauma History: Denied. Visual/auditory hallucinations: Denied. Delusional thoughts: Denied.   Tobacco: Denied. Alcohol: He reported that he and his wife split a bottle of wine nightly. In addition to this, he will also consume 1-2 cocktails prior to and after dinner. He denied a history of problematic alcohol abuse or dependence.  Recreational drugs: Denied.  Family History: Problem Relation Age of Onset   Breast cancer Mother    Heart defect Father    Parkinsonism Brother        vascular parkinsonism   Dementia Maternal Grandmother    This information was confirmed by Mr. Biderman.  Academic/Vocational History: Highest level of educational attainment: 18 years. He reported earning his MBA. He described himself as an average (B/C) student in academic settings. No relative weaknesses were identified.  History of developmental delay: Denied. History of grade repetition: Denied. Enrollment in special education courses: Denied. History of LD/ADHD: Denied.   Employment: Retired. 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   Evaluation Results:   Behavioral Observations: Mr. Marsella was accompanied by his wife, arrived to his appointment on time, and was appropriately dressed and groomed. He appeared alert and oriented. Observed gait and station were within normal limits. Mild tremors were intermittently observed in his upper extremities throughout interview. His affect was generally relaxed and positive, but did range appropriately given the subject being discussed during the clinical interview or the task at hand during testing procedures. Spontaneous speech was fluent and word finding difficulties were not observed during the clinical interview. Thought processes were coherent, organized, and normal in content. Insight into his cognitive difficulties appeared adequate.    During testing, sustained attention was appropriate. Task engagement was adequate and he persisted when challenged. He was noted in being very repetitive throughout testing, repeating the same phrase ("Now you're getting to the root of my problem") many times. Overall, Mr. Laidig was cooperative with the clinical interview and subsequent testing procedures.   Adequacy  of Effort: The validity of neuropsychological testing is limited by the extent to which the individual being tested may be assumed to have exerted adequate effort during testing. Mr. Mikeal expressed his intention to perform to the best of his abilities and exhibited adequate task engagement and persistence. Scores across stand-alone and embedded performance validity measures were within expectation. As such, the results of the current evaluation are believed to be a valid representation of Mr. Rapozo current cognitive functioning.  Test Results: Mr. Traugh was largely oriented at the time of the current evaluation. He incorrectly stated the city portion of his address [employer]" rather than [place], [place]) and was 10 days off when stating the current date.   Intellectual abilities based upon educational and vocational attainment were estimated to be in the average to above average range. Premorbid abilities were estimated to be within the above average range based upon a single-word reading test.   Processing speed was variable, ranging from the well below average to well above average normative ranges. Basic attention was above average to well above average. More complex attention (e.g., working memory) was variable, ranging from the well below average to average normative ranges. Executive functioning was largely average to above average. He did exhibit a significantly elevated error rate across the final condition of a task assessing response inhibition. Performance across a task assessing safety and judgment was average.  Assessed  receptive language abilities were above average. Likewise, Mr. Delisi did not exhibit any difficulties comprehending task instructions and answered all questions asked of him appropriately. Assessed expressive language was mildly variable. Phonemic fluency was average, semantic fluency was well below average, and confrontation naming was above average.  Assessed visuospatial/visuoconstructional abilities were above average to well above average.    Learning (i.e., encoding) of novel verbal information was well below average. Spontaneous delayed recall (i.e., retrieval) of previously learned information was exceptionally low. Retention rates were 0% across a story learning task, 0% across a list learning task, and 0% across a figure drawing task. Performance across recognition tasks was below expectation, suggesting very limited evidence for information consolidation.   Results of emotional screening instruments suggested that recent symptoms of generalized anxiety were in the minimal range, while symptoms of depression were within normal limits. A screening instrument assessing recent sleep quality suggested the presence of minimal sleep dysfunction.  Tables of Scores:   Note: This summary of test scores accompanies the interpretive report and should not be considered in isolation without reference to the appropriate sections in the text. Descriptors are based on appropriate normative data and may be adjusted based on clinical judgment. Terms such as "Within Normal Limits" and "Outside Normal Limits" are used when a more specific description of the test score cannot be determined. Descriptors refer to the current evaluation only.        Percentile - Normative Descriptor > 98 - Exceptionally High 91-97 - Well Above Average 75-90 - Above Average 25-74 - Average 9-24 - Below Average 2-8 - Well Below Average < 2 - Exceptionally Low        Validity: January 2022 Current  DESCRIPTOR        DCT: ---  --- --- Within Normal Limits  RBANS EI: --- --- --- Within Normal Limits  WAIS-IV RDS: --- --- --- Within Normal Limits        Orientation:       Raw Score Raw Score Percentile   NAB Orientation, Form 1 26/29 25/29 --- ---  Cognitive Screening:       Raw Score Raw Score Percentile   SLUMS: 17/30 18/30 --- ---        RBANS, Form A: Standard Score/ Scaled Score Standard Score/ Scaled Score Percentile   Total Score 76 82 12 Below Average  Immediate Memory 76 65 1 Exceptionally Low    List Learning 3 4 2  Well Below Average    Story Memory 8 4 2  Well Below Average  Visuospatial/Constructional 78 131 98 Exceptionally High    Figure Copy 8 14 91 Well Above Average    Line Orientation 11/20 19/20 >75 Above Average  Language 86 86 18 Below Average    Picture Naming 10/10 10/10 >75 Above Average    Semantic Fluency 4 4 2  Well Below Average  Attention 115 106 66 Average    Digit Span 15 15 95 Well Above Average    Coding 10 7 16  Below Average  Delayed Memory 48 44 <1 Exceptionally Low    List Recall 0/10 0/10 <2 Exceptionally Low    List Recognition 13/20 13/20 <2 Exceptionally Low    Story Recall 4 1 <1 Exceptionally Low    Story Recognition 8/12 8/12 14-28 Below Average to Average    Figure Recall 1 1 <1 Exceptionally Low    Figure Recognition 2/8 0/8 <1 Exceptionally Low         Intellectual Functioning:       Standard Score Standard Score Percentile   Test of Premorbid Functioning: 115 117 87 Above Average        Attention/Executive Function:      Trail Making Test (TMT): Raw Score (T Score) Raw Score (T Score) Percentile     Part A 34 secs.,  0 errors (49) 61 secs.,  1 error (36) 8 Well Below Average    Part B 139 secs.,  3 errors (41) 122 secs.,  0 errors (45) 31 Average          Scaled Score Scaled Score Percentile   WAIS-IV Digit Span: 9 8 25  Average    Forward 12 12 75 Above Average    Backward 11 8 25  Average    Sequencing 5 5 5  Well Below Average          Scaled Score Scaled Score Percentile   WAIS-IV Similarities: 9 12 75 Above Average        D-KEFS Color-Word Interference Test: Raw Score (Scaled Score) Raw Score (Scaled Score) Percentile     Color Naming 29 secs. (13) 27 secs. (14) 91 Well Above Average    Word Reading 21 secs. (13) 18 secs. (14) 91 Well Above Average    Inhibition 67 secs. (13) 64 secs. (13) 84 Above Average      Total Errors 3 errors (10) 3 errors (10) 50 Average    Inhibition/Switching 47 secs. (16) 97 secs. (10) 50 Average      Total Errors 24 errors (1) 17 errors (1) <1 Exceptionally Low        NAB Executive Functions Module, Form 1: T Score T Score Percentile     Judgment 54 50 50 Average        Language:      Verbal Fluency Test: Raw Score (Scaled Score) Raw Score (Scaled Score) Percentile     Phonemic Fluency (CFL) 28 (9) 31 (10) 50 Average    Category Fluency 21 (5) 22 (5) 5 Well Below Average  *Based on [employer] Older Normative Studies (MOANS)  NAB Language Module, Form 1: T Score T Score Percentile     Auditory Comprehension 57 57 75 Above Average    Naming 31/31 (63) 30/31 (62) 88 Above Average        Visuospatial/Visuoconstruction:       Raw Score Raw Score Percentile   Clock Drawing: 9/10 8/10 --- Within Normal Limits         Scaled Score Scaled Score Percentile   WAIS-IV Block Design: 13 12 75 Above Average        Mood and Personality:       Raw Score Raw Score Percentile   Geriatric Depression Scale: 0 4 --- Within Normal Limits  Geriatric Anxiety Scale: 3 10 --- Minimal    Somatic 2 5 --- Minimal    Cognitive 0 2 --- Minimal    Affective 1 3 --- Minimal        Additional Questionnaires:       Raw Score Raw Score Percentile   PROMIS Sleep Disturbance Questionnaire: 14 16 --- None to Slight   Informed Consent and Coding/Compliance:   The current evaluation represents a clinical evaluation for the purposes previously outlined by the referral source and is in no way reflective of  a forensic evaluation.   Mr. Surprenant was provided with a verbal description of the nature and purpose of the present neuropsychological evaluation. Also reviewed were the foreseeable risks and/or discomforts and benefits of the procedure, limits of confidentiality, and mandatory reporting requirements of this provider. The patient was given the opportunity to ask questions and receive answers about the evaluation. Oral consent to participate was provided by the patient.   This evaluation was conducted by Newman Nickels, Ph.D., ABPP-CN, board certified clinical neuropsychologist. Mr. Hartline completed a clinical interview with Dr. Milbert Coulter, billed as one unit [id number], and 125 minutes of cognitive testing and scoring, billed as one unit [phone] and three additional units [id number]. Psychometrist Wallace Keller, B.S. assisted Dr. Milbert Coulter with test administration and scoring procedures. As a separate and discrete service, one unit [id number] and two units [phone] were billed for Dr. Tammy Sours time spent in interpretation and report writing.

## 2023-09-27 NOTE — Progress Notes (Signed)
   Psychometrician Note   Cognitive testing was administered to Victor Mcbride by Wallace Keller, B.S. (psychometrist) under the supervision of Victor Mcbride, Ph.D., licensed psychologist on 09/27/2023. Victor Mcbride did not appear overtly distressed by the testing session per behavioral observation or responses across self-report questionnaires. Rest breaks were offered.    The battery of tests administered was selected by Victor Mcbride, Ph.D. with consideration to Victor Mcbride current level of functioning, the nature of his symptoms, emotional and behavioral responses during interview, level of literacy, observed level of motivation/effort, and the nature of the referral question. This battery was communicated to the psychometrist. Communication between Victor Mcbride, Ph.D. and the psychometrist was ongoing throughout the evaluation and Victor Mcbride, Ph.D. was immediately accessible at all times. Victor Mcbride, Ph.D. provided supervision to the psychometrist on the date of this service to the extent necessary to assure the quality of all services provided.    Victor Mcbride will return within approximately 1-2 weeks for an interactive feedback session with Victor Mcbride at which time his test performances, clinical impressions, and treatment recommendations will be reviewed in detail. Victor Mcbride understands he can contact our office should he require our assistance before this time.  A total of 125 minutes of billable time were spent face-to-face with Victor Mcbride by the psychometrist. This includes both test administration and scoring time. Billing for these services is reflected in the clinical report generated by Victor Mcbride, Ph.D.  This note reflects time spent with the psychometrician and does not include test scores or any clinical interpretations made by Victor Mcbride. The full report will follow in a separate note.

## 2023-09-28 ENCOUNTER — Encounter: Payer: Self-pay | Admitting: Psychology

## 2023-10-02 ENCOUNTER — Encounter: Payer: Medicare Other | Admitting: Psychology

## 2023-10-04 ENCOUNTER — Encounter: Payer: Self-pay | Admitting: Psychology

## 2023-10-04 ENCOUNTER — Ambulatory Visit: Payer: Medicare Other | Admitting: Psychology

## 2023-10-04 DIAGNOSIS — I679 Cerebrovascular disease, unspecified: Secondary | ICD-10-CM

## 2023-10-04 DIAGNOSIS — Z789 Other specified health status: Secondary | ICD-10-CM

## 2023-10-04 DIAGNOSIS — G3184 Mild cognitive impairment, so stated: Secondary | ICD-10-CM | POA: Diagnosis not present

## 2023-10-04 DIAGNOSIS — I6381 Other cerebral infarction due to occlusion or stenosis of small artery: Secondary | ICD-10-CM

## 2023-10-04 NOTE — Progress Notes (Signed)
   Neuropsychology Feedback Session Victor Mcbride. [facility] Department of Neurology  Reason for Referral:   Victor Mcbride is a 82 y.o. right-handed Caucasian male referred by Marlowe Kays, PA-C, to characterize his current cognitive functioning and assist with diagnostic clarity and treatment planning in the context of a prior mild neurocognitive disorder diagnosis and concern for progressive cognitive decline.   Feedback:   Victor Mcbride completed a comprehensive neuropsychological evaluation on 09/27/2023. Please refer to that encounter for the full report and recommendations. Briefly, results suggested severe impairment surrounding all aspects of learning and memory. An additional impairment was exhibited across semantic fluency, while performance variability was exhibited across processing speed and complex attention. Relative to his previous evaluation in January 2022, decline could be argued across processing speed, complex attention, and aspects of verbal memory. However, in many cases decline is quite subtle. Notable improvements were exhibited across visuospatial tasks. All other domains exhibited relative stability. Regarding etiology, I continue to have primary concerns surrounding underlying Alzheimer's disease. Across memory testing, Victor Mcbride was fully amnestic (i.e., 0% retention) across all memory tasks after brief delays and generally performed poorly across yes/no recognition trials. This suggests evidence for rapid forgetting and an evolving and already quite severe storage impairment, both of which are the hallmark testing patterns of this illness. Further impairment surrounding semantic fluency would follow typical disease progression. His degree of alcohol consumption may be playing a role in exacerbating memory dysfunction and overall cognitive decline. While there may be a vascular contribution given prior neuroimaging suggesting moderate microvascular disease and  prior lacunar infarctions, this would not explain amnestic memory performances in isolation.  Victor Mcbride was accompanied by his wife during the current feedback session. Content of the current session focused on the results of his neuropsychological evaluation. Victor Mcbride was given the opportunity to ask questions and his questions were answered. He was encouraged to reach out should additional questions arise. A copy of his report was provided at the conclusion of the visit.      One unit [phone] was billed for Dr. Tammy Sours time spent preparing for, conducting, and documenting the current feedback session with Victor Mcbride.

## 2023-10-05 ENCOUNTER — Ambulatory Visit (INDEPENDENT_AMBULATORY_CARE_PROVIDER_SITE_OTHER): Payer: Medicare Other | Admitting: Physician Assistant

## 2023-10-05 ENCOUNTER — Encounter: Payer: Self-pay | Admitting: Physician Assistant

## 2023-10-05 VITALS — BP 136/72 | HR 97 | Resp 20 | Ht 70.0 in | Wt 149.0 lb

## 2023-10-05 DIAGNOSIS — G3184 Mild cognitive impairment, so stated: Secondary | ICD-10-CM

## 2023-10-05 NOTE — Patient Instructions (Signed)
Good to see you!  Continue Donepezil 10mg  daily 3    Follow-up in 6 months    FALL PRECAUTIONS: Be cautious when walking. Scan the area for obstacles that may increase the risk of trips and falls. When getting up in the mornings, sit up at the edge of the bed for a few minutes before getting out of bed. Consider elevating the bed at the head end to avoid drop of blood pressure when getting up. Walk always in a well-lit room (use night lights in the walls). Avoid area rugs or power cords from appliances in the middle of the walkways. Use a walker or a cane if necessary and consider physical therapy for balance exercise. Get your eyesight checked regularly.  FINANCIAL OVERSIGHT: Supervision, especially oversight when making financial decisions or transactions is also recommended.  HOME SAFETY: Consider the safety of the kitchen when operating appliances like stoves, microwave oven, and blender. Consider having supervision and share cooking responsibilities until no longer able to participate in those. Accidents with firearms and other hazards in the house should be identified and addressed as well.  DRIVING: Regarding driving, in patients with progressive memory problems, driving will be impaired. We advise to have someone else do the driving if trouble finding directions or if minor accidents are reported. Independent driving assessment is available to determine safety of driving.  ABILITY TO BE LEFT ALONE: If patient is unable to contact 911 operator, consider using LifeLine, or when the need is there, arrange for someone to stay with patients. Smoking is a fire hazard, consider supervision or cessation. Risk of wandering should be assessed by caregiver and if detected at any point, supervision and safe proof recommendations should be instituted.  MEDICATION SUPERVISION: Inability to self-administer medication needs to be constantly addressed. Implement a mechanism to ensure safe administration of  the medications.  RECOMMENDATIONS FOR ALL PATIENTS WITH MEMORY PROBLEMS: 1. Continue to exercise (Recommend 30 minutes of walking everyday, or 3 hours every week) 2. Increase social interactions - continue going to [place] and enjoy social gatherings with friends and family 3. Eat healthy, avoid fried foods and eat more fruits and vegetables 4. Maintain adequate blood pressure, blood sugar, and blood cholesterol level. Reducing the risk of stroke and cardiovascular disease also helps promoting better memory. 5. Avoid stressful situations. Live a simple life and avoid aggravations. Organize your time and prepare for the next day in anticipation. 6. Sleep well, avoid any interruptions of sleep and avoid any distractions in the bedroom that may interfere with adequate sleep quality 7. Avoid sugar, avoid sweets as there is a strong link between excessive sugar intake, diabetes, and cognitive impairment We discussed the Mediterranean diet, which has been shown to help patients reduce the risk of progressive memory disorders and reduces cardiovascular risk. This includes eating fish, eat fruits and green leafy vegetables, nuts like almonds and hazelnuts, walnuts, and also use olive oil. Avoid fast foods and fried foods as much as possible. Avoid sweets and sugar as sugar use has been linked to worsening of memory function.  There is always a concern of gradual progression of memory problems. If this is the case, then we may need to adjust level of care according to patient needs. Support, both to the patient and caregiver, should then be put into place.

## 2023-10-05 NOTE — Progress Notes (Signed)
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 Patient is on donepezil 10 mg daily.  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 He is able to participate on his ADLs without difficulty.  Continues to drive without getting lost.  He remains very active including frequent swimming. Mood is good.    Recommendations:   Follow up in 6  months. Continue donepezil 10 mg daily as per PCP, side effects discussed.  Monitor for bradycardia. Continue to monitor tremors without other parkinsonian signs. Repeat neuropsych evaluation in 12 to 24 months for clarity of the diagnosis and disease trajectory Recommend good control of cardiovascular risk factors.  Follow-up with prescribing physician blood pressure issues and bradycardia Continue to control mood as per PCP Monitor alcohol intake    Subjective:   This patient is accompanied in the office by his wife who supplements the history. Previous records as well as any outside records available were reviewed prior to todays visit.   Patient was last seen on 03/29/2023 with MMSE 26/30.     Any changes in memory since last visit? " About the same".  Sometimes he forgets names of people that he just meds.  He also forgets recent conversations per wife's report.  He tries to visualize the information to better remember it.  He continues to do computer puzzles and reading extensively. repeats oneself?  Endorsed, especially repeating stories. Disoriented when walking into a room?  Patient denies    Misplacing objects?  Patient denies   Wandering behavior?   denies   Any personality changes since last visit?   denies,  but he may become frustrated that he cannot remember a word. Any worsening depression?: denies   Hallucinations or paranoia?  denies   Seizures?   denies    Any sleep changes? Sleeps well. Denies vivid dreams, REM behavior or sleepwalking   Sleep apnea?   denies   Any hygiene concerns?   denies   Independent of bathing and dressing?  Endorsed  Does the patient needs help with medications? Patient is in charge   Who is in charge of the finances?  Wife is in charge     Any changes in appetite?  Denies.     Patient have trouble swallowing?  Denies.   Does the patient cook?  Any kitchen accidents such as leaving the stove on?   Denies.   Any headaches?    denies   Vision changes? denies Chronic pain?  He has chronic arthritic pain, tries to remain active including swimming regularly to stay fit. Ambulates with difficulty?   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 Recent falls or head injuries? Had a recent fall with clavicular fracture no head injury or LOC, had PT and able to mobilize well.   Any tremors?  He has a history of bilateral hand tremor left greater than right, without affecting his ADLs.  He denies any other parkinsonian signs. Any anosmia?    denies   Any incontinence of urine?  denies   Any bowel dysfunction?  denies      Patient lives  with wife     Does the patient drive?  Endorsed, he denies any issues.     History on Initial Assessment 12/07/2020: This is an 82 year old right-handed man with a history of hypertension, hyperlipidemia, presenting for evaluation of memory loss. He started reporting concerns in 2015, MMSE 30/30 at PCP office. Repeat MMSE in 01/2020 at Dr. Laverle Hobby office was 27/30. He and his wife wanted to start medication and he has been taking Donepezil 10mg  daily for the past 4-5 years without side effects.   He states he is having issues with his memory, he cannot remember where he put things. His  wife started noticing changes with his short-term memory 7-8 years ago, he would repeat the same question and forget details of her answer. There has been a fairly slow progression but changes have been more pronounced over the past year or so. He continues to drive without getting lost, his wife denies any driving concerns. He manages his own medications without difficulties. His wife manages finances, he had been doing the bigger investments and taxes and can still do it. He keeps repeating that he has become more dependent on her to do these things. No word-finding difficulties. He is independent with dressing and bathing, no hygiene concerns. He has noticed more difficulties reading, he has a hard time keeping track of characters. 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 He states mood is generally good, his wife notes he gets more frustrated now when he cannot remember something. No paranoia or hallucinations.   He denies any headaches, dizziness, diplopia, dysarthria, dysphagia, neck/back pain, focal numbness/tingling/weakness, bowel/bladder dysfunction, anosmia, no falls. He has had hand tremors for many years. He sleeps fine, but sometimes wakes up a few times and thinks about something. He walks it off and goes back to sleep. He dozes off in the day sometimes. His wife notes snoring, no REM behavior disorder. His maternal grandmother had dementia in her 58s. He denies any significant head injuries. He and his wife split a bottle of wine every night, he drinks 1.5-2 oz of scotch sometimes. They moved to [place] 3 years ago.     Neuropsych evaluation 09/27/2023   Briefly, results suggested severe impairment surrounding all aspects of learning and memory. An additional impairment was exhibited across semantic fluency, while performance variability was exhibited across processing speed and complex attention. Relative to his previous evaluation in January 2022, decline  could be argued across processing speed, complex attention, and aspects of verbal memory. However, in many cases decline is quite subtle. Notable improvements were exhibited across visuospatial tasks. All other domains exhibited relative stability. Regarding etiology, I continue to have primary concerns surrounding underlying Alzheimer's disease. Across memory testing, Mr. Uy was fully amnestic (i.e., 0% retention) across all memory tasks after brief delays and generally performed poorly across yes/no recognition trials. This suggests evidence for rapid forgetting and an evolving and already quite severe storage impairment, both of which are the hallmark testing patterns of this illness. Further impairment surrounding semantic fluency would follow typical disease progression. His degree of alcohol consumption may be playing a role in exacerbating memory dysfunction and overall cognitive decline. 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        Past Medical History:  Diagnosis Date   Amnestic MCI (mild cognitive impairment with memory loss)  12/22/2020   Cerebrovascular disease 2022   Moderate per MRI    Elevated alcohol use    Essential tremor    Hyperlipidemia    Hypertension    Multiple lacunar infarcts 2022   2022 MRI - Chronic lacunar insults involving the corona radiata, basal ganglia, midbrain, and left cerebellum     History reviewed. No pertinent surgical history.   PREVIOUS MEDICATIONS:   CURRENT MEDICATIONS:  Outpatient Encounter Medications as of 10/05/2023  Medication Sig   amLODIPine Besylate-Celecoxib 2.5-200 MG TABS Take by mouth.   atenolol (TENORMIN) 25 MG tablet Take 25 mg by mouth daily.   cholecalciferol (VITAMIN D3) 25 MCG (1000 UNIT) tablet Take 1,000 Units by mouth daily.   donepezil (ARICEPT) 10 MG  tablet Take 10 mg by mouth daily.   loratadine (CLARITIN) 10 MG tablet Take 10 mg by mouth daily.   losartan (COZAAR) 50 MG tablet Take 50 mg by mouth daily.   Multiple Vitamin (MULTIVITAMIN) tablet Take 1 tablet by mouth daily.   Omega-3 Fatty Acids (FISH OIL) 1000 MG CAPS Take 1,000 mg by mouth daily.   omeprazole (PRILOSEC) 20 MG capsule Take 20 mg by mouth 2 (two) times a week. Pt takes every Monday and Friday   simvastatin (ZOCOR) 10 MG tablet Take 10 mg by mouth daily.   No facility-administered encounter medications on file as of 10/05/2023.     Objective:     PHYSICAL EXAMINATION:    VITALS:   Vitals:   10/05/23 1044  BP: 136/72  Pulse: 97  Resp: 20  SpO2: 98%  Weight: 149 lb (67.6 kg)  Height: 5\' 10"  (1.778 m)    GEN:  The patient appears stated age and is in NAD. HEENT:  Normocephalic, atraumatic.   Neurological examination:  General: NAD, well-groomed, appears stated age. Orientation: The patient is alert. Oriented to person, place and date Cranial nerves: There is good facial symmetry.The speech is fluent and clear. No aphasia or dysarthria. Fund of knowledge is appropriate. Recent memory impaired and remote memory is normal.  Attention and concentration are normal.  Able to name objects and repeat phrases.  Hearing is intact to conversational tone .    Sensation: Sensation is intact to light touch throughout Motor: Strength is at least antigravity x4. DTR's 2/4 in UE/LE      12/07/2020   10:00 AM  Montreal Cognitive Assessment   Visuospatial/ Executive (0/5) 3  Naming (0/3) 3  Attention: Read list of digits (0/2) 2  Attention: Read list of letters (0/1) 1  Attention: Serial 7 subtraction starting at 100 (0/3) 3  Language: Repeat phrase (0/2) 2  Language : Fluency (0/1) 0  Abstraction (0/2) 2  Delayed Recall (0/5) 0  Orientation (0/6) 4  Total 20       03/29/2023   12:00 PM 04/05/2022   12:00 PM 08/16/2021   12:00 PM  MMSE - Mini Mental State Exam   Orientation to time 4 5 3   Orientation to Place 5 4 5   Registration 3 3 3   Attention/ Calculation 5 5 5   Recall 0 0 0  Language- name 2 objects 2 2 2   Language- repeat 1 1 1   Language- follow 3 step command 3 3 3   Language- read & follow direction 1 1 1   Write a sentence 1 1 1   Copy design 1 1 1   Total score 26 26 25        Movement examination: Tone: There is normal tone in the UE/LE.  No cogwheeling Abnormal movements:  B intention tremor L>R without other parkinsonian features.  No myoclonus.  No asterixis.   Coordination:  There is no decremation with RAM's. Normal finger to nose  Gait and Station: The patient has no difficulty arising out of a deep-seated chair without the use of the hands. The patient's stride length is good.  Gait is cautious and narrow.   Thank you for allowing [place] the opportunity to participate in the care of this nice patient. Please do not hesitate to contact us for any questions or concerns.   Total time spent on today's visit was 25 minutes dedicated to this patient today, preparing to see patient, examining the patient, ordering tests and/or medications and counseling the patient, documenting clinical information in the EHR or other health record, independently interpreting results and communicating results to the patient/family, discussing treatment and goals, answering patient's questions and coordinating care.  Cc:  Thana Ates, MD  Marlowe Kays 10/05/2023 11:54 AM

## 2023-12-27 ENCOUNTER — Other Ambulatory Visit: Payer: Self-pay | Admitting: Internal Medicine

## 2023-12-27 DIAGNOSIS — R001 Bradycardia, unspecified: Secondary | ICD-10-CM | POA: Diagnosis not present

## 2023-12-27 DIAGNOSIS — I10 Essential (primary) hypertension: Secondary | ICD-10-CM | POA: Diagnosis not present

## 2023-12-27 DIAGNOSIS — R0989 Other specified symptoms and signs involving the circulatory and respiratory systems: Secondary | ICD-10-CM

## 2023-12-27 DIAGNOSIS — M7062 Trochanteric bursitis, left hip: Secondary | ICD-10-CM | POA: Diagnosis not present

## 2023-12-27 DIAGNOSIS — I719 Aortic aneurysm of unspecified site, without rupture: Secondary | ICD-10-CM | POA: Diagnosis not present

## 2024-01-02 ENCOUNTER — Ambulatory Visit
Admission: RE | Admit: 2024-01-02 | Discharge: 2024-01-02 | Disposition: A | Discharge status: Home / Self Care | Payer: Medicare Other | Source: Ambulatory Visit | Attending: Internal Medicine | Admitting: Internal Medicine

## 2024-01-02 ENCOUNTER — Other Ambulatory Visit: Payer: Self-pay | Admitting: Internal Medicine

## 2024-01-02 DIAGNOSIS — R0989 Other specified symptoms and signs involving the circulatory and respiratory systems: Secondary | ICD-10-CM

## 2024-01-24 DIAGNOSIS — H5213 Myopia, bilateral: Secondary | ICD-10-CM | POA: Diagnosis not present

## 2024-01-24 DIAGNOSIS — H2513 Age-related nuclear cataract, bilateral: Secondary | ICD-10-CM | POA: Diagnosis not present

## 2024-01-24 DIAGNOSIS — H25013 Cortical age-related cataract, bilateral: Secondary | ICD-10-CM | POA: Diagnosis not present

## 2024-02-02 DIAGNOSIS — I10 Essential (primary) hypertension: Secondary | ICD-10-CM | POA: Diagnosis not present

## 2024-02-26 DIAGNOSIS — G301 Alzheimer's disease with late onset: Secondary | ICD-10-CM | POA: Diagnosis not present

## 2024-02-26 DIAGNOSIS — M19042 Primary osteoarthritis, left hand: Secondary | ICD-10-CM | POA: Diagnosis not present

## 2024-02-26 DIAGNOSIS — E7800 Pure hypercholesterolemia, unspecified: Secondary | ICD-10-CM | POA: Diagnosis not present

## 2024-02-26 DIAGNOSIS — I10 Essential (primary) hypertension: Secondary | ICD-10-CM | POA: Diagnosis not present

## 2024-03-02 DIAGNOSIS — I10 Essential (primary) hypertension: Secondary | ICD-10-CM | POA: Diagnosis not present

## 2024-03-25 DIAGNOSIS — D2261 Melanocytic nevi of right upper limb, including shoulder: Secondary | ICD-10-CM | POA: Diagnosis not present

## 2024-03-25 DIAGNOSIS — M7130 Other bursal cyst, unspecified site: Secondary | ICD-10-CM | POA: Diagnosis not present

## 2024-03-25 DIAGNOSIS — L570 Actinic keratosis: Secondary | ICD-10-CM | POA: Diagnosis not present

## 2024-03-25 DIAGNOSIS — L821 Other seborrheic keratosis: Secondary | ICD-10-CM | POA: Diagnosis not present

## 2024-03-25 DIAGNOSIS — L812 Freckles: Secondary | ICD-10-CM | POA: Diagnosis not present

## 2024-03-27 DIAGNOSIS — I10 Essential (primary) hypertension: Secondary | ICD-10-CM | POA: Diagnosis not present

## 2024-03-27 DIAGNOSIS — E7800 Pure hypercholesterolemia, unspecified: Secondary | ICD-10-CM | POA: Diagnosis not present

## 2024-03-27 DIAGNOSIS — M19042 Primary osteoarthritis, left hand: Secondary | ICD-10-CM | POA: Diagnosis not present

## 2024-03-27 DIAGNOSIS — G301 Alzheimer's disease with late onset: Secondary | ICD-10-CM | POA: Diagnosis not present

## 2024-04-01 DIAGNOSIS — I10 Essential (primary) hypertension: Secondary | ICD-10-CM | POA: Diagnosis not present

## 2024-04-03 ENCOUNTER — Ambulatory Visit: Payer: Medicare Other | Admitting: Physician Assistant

## 2024-04-11 ENCOUNTER — Ambulatory Visit (INDEPENDENT_AMBULATORY_CARE_PROVIDER_SITE_OTHER): Admitting: Physician Assistant

## 2024-04-11 ENCOUNTER — Encounter: Payer: Self-pay | Admitting: Physician Assistant

## 2024-04-11 VITALS — BP 137/64 | HR 80 | Resp 20 | Ht 70.0 in | Wt 151.0 lb

## 2024-04-11 DIAGNOSIS — G3184 Mild cognitive impairment, so stated: Secondary | ICD-10-CM | POA: Diagnosis not present

## 2024-04-11 DIAGNOSIS — G250 Essential tremor: Secondary | ICD-10-CM

## 2024-04-11 MED ORDER — MEMANTINE HCL 5 MG PO TABS: ORAL_TABLET | ORAL | 3 refills | Status: DC

## 2024-04-11 NOTE — Patient Instructions (Addendum)
 Good to see you!  Continue Donepezil 10mg  daily Start Memantine 5mg  tablets.  Take 1 tablet at bedtime for 2 weeks, then 1 tablet twice daily.   3    Follow-up in 6 months    FALL PRECAUTIONS: Be cautious when walking. Scan the area for obstacles that may increase the risk of trips and falls. When getting up in the mornings, sit up at the edge of the bed for a few minutes before getting out of bed. Consider elevating the bed at the head end to avoid drop of blood pressure when getting up. Walk always in a well-lit room (use night lights in the walls). Avoid area rugs or power cords from appliances in the middle of the walkways. Use a walker or a cane if necessary and consider physical therapy for balance exercise. Get your eyesight checked regularly.  FINANCIAL OVERSIGHT: Supervision, especially oversight when making financial decisions or transactions is also recommended.  HOME SAFETY: Consider the safety of the kitchen when operating appliances like stoves, microwave oven, and blender. Consider having supervision and share cooking responsibilities until no longer able to participate in those. Accidents with firearms and other hazards in the house should be identified and addressed as well.  DRIVING: Regarding driving, in patients with progressive memory problems, driving will be impaired. We advise to have someone else do the driving if trouble finding directions or if minor accidents are reported. Independent driving assessment is available to determine safety of driving.  ABILITY TO BE LEFT ALONE: If patient is unable to contact 911 operator, consider using LifeLine, or when the need is there, arrange for someone to stay with patients. Smoking is a fire hazard, consider supervision or cessation. Risk of wandering should be assessed by caregiver and if detected at any point, supervision and safe proof recommendations should be instituted.  MEDICATION SUPERVISION: Inability to self-administer  medication needs to be constantly addressed. Implement a mechanism to ensure safe administration of the medications.  RECOMMENDATIONS FOR ALL PATIENTS WITH MEMORY PROBLEMS: 1. Continue to exercise (Recommend 30 minutes of walking everyday, or 3 hours every week) 2. Increase social interactions - continue going to [place] and enjoy social gatherings with friends and family 3. Eat healthy, avoid fried foods and eat more fruits and vegetables 4. Maintain adequate blood pressure, blood sugar, and blood cholesterol level. Reducing the risk of stroke and cardiovascular disease also helps promoting better memory. 5. Avoid stressful situations. Live a simple life and avoid aggravations. Organize your time and prepare for the next day in anticipation. 6. Sleep well, avoid any interruptions of sleep and avoid any distractions in the bedroom that may interfere with adequate sleep quality 7. Avoid sugar, avoid sweets as there is a strong link between excessive sugar intake, diabetes, and cognitive impairment We discussed the Mediterranean diet, which has been shown to help patients reduce the risk of progressive memory disorders and reduces cardiovascular risk. This includes eating fish, eat fruits and green leafy vegetables, nuts like almonds and hazelnuts, walnuts, and also use olive oil. Avoid fast foods and fried foods as much as possible. Avoid sweets and sugar as sugar use has been linked to worsening of memory function.  There is always a concern of gradual progression of memory problems. If this is the case, then we may need to adjust level of care according to patient needs. Support, both to the patient and caregiver, should then be put into place.

## 2024-04-11 NOTE — Progress Notes (Addendum)
 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 Patient is on donepezil 10 mg daily by PCP, tolerating well.  Slight cognitive decline is noted, with MMSE today at 23/30.  Discussed adding memantine  to the regiment at 5 mg twice daily, and if tolerated, we may consider increasing it to 10 mg twice daily, patient agrees to proceed.  He is still able to participate on his ADLs without difficulty, and is able to drive long distances.  His mood is good.  He remains active.  He no longer drinks alcohol.     Recommendations:   Follow up in 6 months. Continue donepezil 10 mg daily by PCP Start memantine   5 mg daily as directed, side effects discussed Repeat neuropsych evaluation 6 to 12 months for diagnostic clarity and disease trajectory Recommend good control of cardiovascular risk factors Continue to monitor tremors without other parkinsonian signs. Continue to control mood as per PCP    Subjective:   This patient is accompanied in the office by his wife  who supplements the history. Previous records as well as any outside records available were reviewed prior to todays visit.   Patient was last seen on 10/05/2023, last MMSE 26/30    Any changes in memory since last visit? "I think I am doing better".  Sometimes he forgets names of people that he just meets.  He also may forget recent conversations and new information.  He tries to visualize information to better remember it.  He continues to do  puzzles and reading extensively. Takes a lot of classes, attends musical programs, etc.  repeats oneself?  Endorsed, especially repeating stories, "but this has diminished"-wife says Disoriented when walking into a room?  Patient denies     Misplacing objects?  Patient denies.    Wandering behavior?   Denies.   Any personality changes since last visit?   Denies. "I am friendlier"   Any worsening depression?: denies.   Hallucinations or paranoia?  Denies.   Seizures?   Denies.    Any sleep changes? Sleeps well in sections, after 1 am I may wake up a few times, for example can go back to sleep. Denies vivid dreams, REM behavior or sleepwalking   Sleep apnea?   denies    Any hygiene concerns?   Denies.   Independent of bathing and dressing?  Endorsed  Does the patient needs help with medications? Patient is in charge  Who is in charge of the finances?  Wife is in charge     Any changes in appetite?  Denies, drinking more water.    Patient have trouble swallowing?  Denies.   Does the patient cook?  Any kitchen accidents such as leaving the stove on? Denies.   Any headaches?    denies   Vision changes? denies Chronic pain?  He has chronic arthritic pain, tries to remain active including swimming regularly and staying fit.  Ambulates with difficulty?    Denies. Going [place] the mountains soon and there he walks 1 h after breakfast, attends stretch and strength classes, balance class and swimming     Recent falls or head injuries?    Denies.      Unilateral weakness, numbness or tingling?   Denies.   Any tremors?  He has a history of bilateral hand tremor, left greater than right, without affecting his ADLs.  He denies any other parkinsonian signs Any anosmia?    denies   Any incontinence of urine?  denies   Any bowel dysfunction?  denies      Patient lives with wife in [place] independent living.    Does the patient drive?  Endorsed, he has been summer place in [place]  at the mountains and he is driving there very soon Stopped  alcohol intake.    History on Initial Assessment 12/07/2020: This is an 83 year old right-handed man with a history of hypertension, hyperlipidemia, presenting for evaluation of  memory loss. He started reporting concerns in 2015, MMSE 30/30 at PCP office. Repeat MMSE in 01/2020 at Dr. Jaquita Mcbride office was 27/30. He and his wife wanted to start medication and he has been taking Donepezil 10mg  daily for the past 4-5 years without side effects.   He states he is having issues with his memory, he cannot remember where he put things. His wife started noticing changes with his short-term memory 7-8 years ago, he would repeat the same question and forget details of her answer. There has been a fairly slow progression but changes have been more pronounced over the past year or so. He continues to drive without getting lost, his wife denies any driving concerns. He manages his own medications without difficulties. His wife manages finances, he had been doing the bigger investments and taxes and can still do it. He keeps repeating that he has become more dependent on her to do these things. No word-finding difficulties. He is independent with dressing and bathing, no hygiene concerns. He has noticed more difficulties reading, he has a hard time keeping track of characters. 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 He states mood is generally good, his wife notes he gets more frustrated now when he cannot remember something. No paranoia or hallucinations.   He denies any headaches, dizziness, diplopia, dysarthria, dysphagia, neck/back pain, focal numbness/tingling/weakness, bowel/bladder dysfunction, anosmia, no falls. He has had hand tremors for many years. He sleeps fine, but sometimes wakes up a few times and thinks about something. He walks it off and goes back to sleep. He dozes off in the day sometimes. His wife notes snoring, no REM behavior disorder. His maternal grandmother had dementia in her 67s. He denies any significant head injuries. He and his wife split a bottle of wine every night, he drinks 1.5-2 oz of scotch sometimes. They moved to [place] 3  years ago.        Neuropsych evaluation 09/27/2023   Briefly, results suggested severe impairment surrounding all aspects of learning and memory. An additional impairment was exhibited across semantic fluency, while performance variability was exhibited across processing speed and complex attention. Relative to his previous evaluation in January 2022, decline could be argued across processing speed, complex attention, and aspects of verbal memory. However, in many cases decline is quite subtle. Notable improvements were exhibited across visuospatial tasks. All other domains exhibited relative stability. Regarding etiology, I continue to have primary concerns surrounding underlying Alzheimer's disease. Across memory testing, Victor Mcbride was fully amnestic (i.e., 0% retention) across all memory tasks after brief delays and generally performed poorly across yes/no recognition trials. This suggests evidence for rapid forgetting and an evolving and already quite severe storage impairment, both of which are the hallmark testing patterns of this illness. Further impairment  surrounding semantic fluency would follow typical disease progression. His degree of alcohol consumption may be playing a role in exacerbating memory dysfunction and overall cognitive decline. 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  Past Medical History:  Diagnosis Date   Amnestic MCI (mild cognitive impairment with memory loss) 12/22/2020   Cerebrovascular disease 2022   Moderate per MRI    Elevated alcohol use    Essential tremor    Hyperlipidemia    Hypertension    Multiple lacunar infarcts 2022   2022 MRI - Chronic lacunar insults involving the corona radiata, basal ganglia, midbrain, and left cerebellum     History reviewed. No pertinent surgical history.    PREVIOUS MEDICATIONS:   CURRENT MEDICATIONS:  Outpatient Encounter Medications as of 04/11/2024  Medication Sig   amLODIPine Besylate-Celecoxib 2.5-200 MG TABS Take by mouth.   atenolol (TENORMIN) 25 MG tablet Take 25 mg by mouth daily.   cholecalciferol (VITAMIN D3) 25 MCG (1000 UNIT) tablet Take 1,000 Units by mouth daily.   donepezil (ARICEPT) 10 MG tablet Take 10 mg by mouth daily.   loratadine (CLARITIN) 10 MG tablet Take 10 mg by mouth daily.   losartan (COZAAR) 50 MG tablet Take 50 mg by mouth daily.   memantine  (NAMENDA ) 5 MG tablet Take 1 tablet (5 mg at night) for 2 weeks, then increase to 1 tablet (5 mg) twice a day   Multiple Vitamin (MULTIVITAMIN) tablet Take 1 tablet by mouth daily.   omeprazole (PRILOSEC) 20 MG capsule Take 20 mg by mouth 2 (two) times a week. Pt takes every Monday and Friday   simvastatin (ZOCOR) 10 MG tablet Take 10 mg by mouth daily.   Omega-3 Fatty Acids (FISH OIL) 1000 MG CAPS Take 1,000 mg by mouth daily.   No facility-administered encounter medications on file as of 04/11/2024.     Objective:     PHYSICAL EXAMINATION:    VITALS:   Vitals:   04/11/24 1407  BP: 137/64  Pulse: 80  Resp: 20  SpO2: 96%  Weight: 151 lb (68.5 kg)  Height: 5\' 10"  (1.778 m)    GEN:  The patient appears stated age and is in NAD. HEENT:  Normocephalic, atraumatic.   Neurological examination:  General: NAD, well-groomed, appears stated age. Orientation: The patient is alert. Oriented to person, place and  not to date Cranial nerves: There is good facial symmetry.The speech is fluent and clear. No aphasia or dysarthria. Fund of knowledge is appropriate. Recent memory impaired and remote memory is normal.  Attention and concentration are normal.  Able to name objects and repeat phrases.  Hearing is intact to conversational tone.   Delayed recall 0/3 Sensation: Sensation is intact to light touch throughout Motor: Strength is at least antigravity x4. DTR's 2/4  in UE/LE      12/07/2020   10:00 AM  Montreal Cognitive Assessment   Visuospatial/ Executive (0/5) 3  Naming (0/3) 3  Attention: Read list of digits (0/2) 2  Attention: Read list of letters (0/1) 1  Attention: Serial 7 subtraction starting at 100 (0/3) 3  Language: Repeat phrase (0/2) 2  Language : Fluency (0/1) 0  Abstraction (0/2) 2  Delayed Recall (0/5) 0  Orientation (0/6) 4  Total 20       04/11/2024    5:00 PM 03/29/2023   12:00 PM 04/05/2022   12:00 PM  MMSE -  Mini Mental State Exam  Orientation to time 2 4 5   Orientation to Place 5 5 4   Registration 3 3 3   Attention/ Calculation 5 5 5   Recall 0 0 0  Language- name 2 objects 2 2 2   Language- repeat 1 1 1   Language- follow 3 step command 3 3 3   Language- read & follow direction 1 1 1   Write a sentence 1 1 1   Copy design 0 1 1  Total score 23 26 26        Movement examination: Tone: There is normal tone in the UE/LE, no cogwheeling Abnormal movements: Bilateral intention tremor, left greater than right, without any other parkinsonian features.  No myoclonus.  No asterixis.   Coordination:  There is no decremation with RAM's. Normal finger to nose  Gait and Station: The patient has no difficulty arising out of a deep-seated chair without the use of the hands. The patient's stride length is good.  Gait is cautious and narrow.   Thank you for allowing us  the opportunity to participate in the care of this nice patient. Please do not hesitate to contact us  for any questions or concerns.   Total time spent on today's visit was 34 minutes dedicated to this patient today, preparing to see patient, examining the patient, ordering tests and/or medications and counseling the patient, documenting clinical information in the EHR or other health record, independently interpreting results and communicating results to the patient/family, discussing treatment and goals, answering patient's questions and coordinating care.  Cc:  Tena Feeling, MD  Tex Filbert 04/11/2024 5:06 PM

## 2024-04-15 DIAGNOSIS — M25559 Pain in unspecified hip: Secondary | ICD-10-CM | POA: Diagnosis not present

## 2024-04-15 DIAGNOSIS — I10 Essential (primary) hypertension: Secondary | ICD-10-CM | POA: Diagnosis not present

## 2024-04-15 DIAGNOSIS — Z23 Encounter for immunization: Secondary | ICD-10-CM | POA: Diagnosis not present

## 2024-04-15 DIAGNOSIS — Z1331 Encounter for screening for depression: Secondary | ICD-10-CM | POA: Diagnosis not present

## 2024-04-15 DIAGNOSIS — G301 Alzheimer's disease with late onset: Secondary | ICD-10-CM | POA: Diagnosis not present

## 2024-04-15 DIAGNOSIS — E7800 Pure hypercholesterolemia, unspecified: Secondary | ICD-10-CM | POA: Diagnosis not present

## 2024-04-15 DIAGNOSIS — Z79899 Other long term (current) drug therapy: Secondary | ICD-10-CM | POA: Diagnosis not present

## 2024-04-15 DIAGNOSIS — M109 Gout, unspecified: Secondary | ICD-10-CM | POA: Diagnosis not present

## 2024-04-15 DIAGNOSIS — I719 Aortic aneurysm of unspecified site, without rupture: Secondary | ICD-10-CM | POA: Diagnosis not present

## 2024-04-15 DIAGNOSIS — Z0000 Encounter for general adult medical examination without abnormal findings: Secondary | ICD-10-CM | POA: Diagnosis not present

## 2024-04-27 DIAGNOSIS — I10 Essential (primary) hypertension: Secondary | ICD-10-CM | POA: Diagnosis not present

## 2024-04-27 DIAGNOSIS — G301 Alzheimer's disease with late onset: Secondary | ICD-10-CM | POA: Diagnosis not present

## 2024-04-27 DIAGNOSIS — M19042 Primary osteoarthritis, left hand: Secondary | ICD-10-CM | POA: Diagnosis not present

## 2024-04-27 DIAGNOSIS — E7800 Pure hypercholesterolemia, unspecified: Secondary | ICD-10-CM | POA: Diagnosis not present

## 2024-05-01 DIAGNOSIS — I10 Essential (primary) hypertension: Secondary | ICD-10-CM | POA: Diagnosis not present

## 2024-05-27 DIAGNOSIS — M19042 Primary osteoarthritis, left hand: Secondary | ICD-10-CM | POA: Diagnosis not present

## 2024-05-27 DIAGNOSIS — E7800 Pure hypercholesterolemia, unspecified: Secondary | ICD-10-CM | POA: Diagnosis not present

## 2024-05-27 DIAGNOSIS — G301 Alzheimer's disease with late onset: Secondary | ICD-10-CM | POA: Diagnosis not present

## 2024-05-27 DIAGNOSIS — I10 Essential (primary) hypertension: Secondary | ICD-10-CM | POA: Diagnosis not present

## 2024-05-31 DIAGNOSIS — I10 Essential (primary) hypertension: Secondary | ICD-10-CM | POA: Diagnosis not present

## 2024-06-14 ENCOUNTER — Other Ambulatory Visit: Payer: Self-pay

## 2024-06-14 MED ORDER — MEMANTINE HCL 5 MG PO TABS: ORAL_TABLET | ORAL | 3 refills | Status: AC

## 2024-06-27 DIAGNOSIS — E7800 Pure hypercholesterolemia, unspecified: Secondary | ICD-10-CM | POA: Diagnosis not present

## 2024-06-27 DIAGNOSIS — I10 Essential (primary) hypertension: Secondary | ICD-10-CM | POA: Diagnosis not present

## 2024-06-27 DIAGNOSIS — M19042 Primary osteoarthritis, left hand: Secondary | ICD-10-CM | POA: Diagnosis not present

## 2024-06-27 DIAGNOSIS — G301 Alzheimer's disease with late onset: Secondary | ICD-10-CM | POA: Diagnosis not present

## 2024-06-30 DIAGNOSIS — I10 Essential (primary) hypertension: Secondary | ICD-10-CM | POA: Diagnosis not present

## 2024-07-28 DIAGNOSIS — M19042 Primary osteoarthritis, left hand: Secondary | ICD-10-CM | POA: Diagnosis not present

## 2024-07-28 DIAGNOSIS — I10 Essential (primary) hypertension: Secondary | ICD-10-CM | POA: Diagnosis not present

## 2024-07-28 DIAGNOSIS — G301 Alzheimer's disease with late onset: Secondary | ICD-10-CM | POA: Diagnosis not present

## 2024-07-28 DIAGNOSIS — E7800 Pure hypercholesterolemia, unspecified: Secondary | ICD-10-CM | POA: Diagnosis not present

## 2024-08-13 DIAGNOSIS — I10 Essential (primary) hypertension: Secondary | ICD-10-CM | POA: Diagnosis not present

## 2024-08-14 ENCOUNTER — Other Ambulatory Visit: Payer: Self-pay | Admitting: Surgery

## 2024-08-14 DIAGNOSIS — I712 Thoracic aortic aneurysm, without rupture, unspecified: Secondary | ICD-10-CM

## 2024-08-27 DIAGNOSIS — M19042 Primary osteoarthritis, left hand: Secondary | ICD-10-CM | POA: Diagnosis not present

## 2024-08-27 DIAGNOSIS — G301 Alzheimer's disease with late onset: Secondary | ICD-10-CM | POA: Diagnosis not present

## 2024-08-27 DIAGNOSIS — I10 Essential (primary) hypertension: Secondary | ICD-10-CM | POA: Diagnosis not present

## 2024-08-27 DIAGNOSIS — E7800 Pure hypercholesterolemia, unspecified: Secondary | ICD-10-CM | POA: Diagnosis not present

## 2024-09-12 DIAGNOSIS — I10 Essential (primary) hypertension: Secondary | ICD-10-CM | POA: Diagnosis not present

## 2024-09-18 ENCOUNTER — Ambulatory Visit (HOSPITAL_COMMUNITY)
Admission: RE | Admit: 2024-09-18 | Discharge: 2024-09-18 | Disposition: A | Discharge status: Home / Self Care | Source: Ambulatory Visit | Attending: Surgery | Admitting: Surgery

## 2024-09-18 DIAGNOSIS — I2510 Atherosclerotic heart disease of native coronary artery without angina pectoris: Secondary | ICD-10-CM | POA: Diagnosis not present

## 2024-09-18 DIAGNOSIS — I7121 Aneurysm of the ascending aorta, without rupture: Secondary | ICD-10-CM | POA: Diagnosis not present

## 2024-09-18 DIAGNOSIS — I712 Thoracic aortic aneurysm, without rupture, unspecified: Secondary | ICD-10-CM | POA: Insufficient documentation

## 2024-09-18 DIAGNOSIS — I700 Atherosclerosis of aorta: Secondary | ICD-10-CM | POA: Diagnosis not present

## 2024-09-18 MED ORDER — IOHEXOL 350 MG/ML SOLN
75.0000 mL | Freq: Once | INTRAVENOUS | Status: AC | PRN
  Administered 2024-09-18: 75 mL via INTRAVENOUS

## 2024-09-25 ENCOUNTER — Ambulatory Visit

## 2024-09-25 VITALS — BP 163/76 | HR 44 | Resp 18 | Ht 70.0 in | Wt 156.0 lb

## 2024-09-25 DIAGNOSIS — I7121 Aneurysm of the ascending aorta, without rupture: Secondary | ICD-10-CM | POA: Diagnosis not present

## 2024-09-25 NOTE — Patient Instructions (Signed)

## 2024-09-25 NOTE — Progress Notes (Signed)
 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  This was found incidentally in 2023 when he sustained a fall. Aneurysm has stayed stable in size measuring 4.2 cm on recent CTA of chest.   He reports that he has been doing well recently.  His blood pressure is well-controlled with current medication therapy although it is elevated at today's visit.  He does have a home blood pressure cuff which he checks his readings on.  He exercises almost daily with swimming, walking, exercise classes and resistance training.  He denies heavy lifting.  He denies chest pain, shortness of breath or lower leg swelling.   Current Outpatient Medications on File Prior to Visit  Medication Sig Dispense Refill   amLODIPine Besylate-Celecoxib 2.5-200 MG TABS Take by mouth.     atenolol (TENORMIN) 25 MG tablet Take 25 mg by mouth daily.     cholecalciferol (VITAMIN D3) 25 MCG (1000 UNIT) tablet Take 1,000 Units by mouth daily.     donepezil (ARICEPT) 10 MG tablet Take 10 mg by mouth daily.     loratadine (CLARITIN) 10 MG tablet Take 10 mg by mouth daily.     losartan (COZAAR) 50 MG tablet Take 50 mg by mouth daily.     memantine  (NAMENDA ) 5 MG tablet Take 1 tablet (5 mg at night) for 2 weeks, then increase to 1 tablet (5 mg) twice a day 180 tablet 3   Multiple Vitamin (MULTIVITAMIN) tablet Take 1 tablet by mouth daily.     Omega-3 Fatty Acids (FISH OIL) 1000 MG CAPS Take 1,000 mg by mouth daily.     omeprazole (PRILOSEC) 20 MG capsule Take 20 mg by mouth 2 (two) times a week. Pt takes every Monday and Friday     simvastatin (ZOCOR) 10 MG tablet  Take 10 mg by mouth daily.     No current facility-administered medications on file prior to visit.     ROS: Review of Systems  Constitutional: Negative.  Negative for malaise/fatigue.  Respiratory: Negative.  Negative for cough and shortness of breath.   Cardiovascular:  Negative for chest pain, palpitations and leg swelling.     BP (!) 163/76 (BP Location: Right Arm)   Pulse (!) 44   Resp 18   Ht 5' 10 (1.778 m)   Wt 156 lb (70.8 kg)   SpO2 96%   BMI 22.38 kg/m   Physical Exam Constitutional:      Appearance: Normal appearance.  HENT:     Head: Normocephalic and atraumatic.  Cardiovascular:     Rate and Rhythm: Normal rate and regular rhythm.     Heart sounds: Normal heart sounds, S1 normal and S2 normal.  Pulmonary:     Effort: Pulmonary effort is normal.     Breath sounds: Normal breath sounds.  Skin:    General: Skin is warm and dry.  Neurological:     General: No focal deficit present.     Mental Status: He is alert and oriented to person, place, and time.  Imaging: CLINICAL DATA:  Follow-up thoracic aortic aneurysm.   EXAM: CT ANGIOGRAPHY CHEST WITH CONTRAST   TECHNIQUE: Multidetector CT imaging of the chest was performed using the standard protocol during bolus administration of intravenous contrast. Multiplanar CT image reconstructions and MIPs were obtained to evaluate the vascular anatomy.   RADIATION DOSE REDUCTION: This exam was performed according to the departmental dose-optimization program which includes automated exposure control, adjustment of the mA and/or kV according to patient size and/or use of iterative reconstruction technique.   CONTRAST:  75mL OMNIPAQUE IOHEXOL 350 MG/ML SOLN   COMPARISON:  07/26/2023   FINDINGS: Cardiovascular: 4.2 cm ascending thoracic aortic aneurysm again noted, unchanged. No dissection. Heart is normal size. 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    Mediastinum/Nodes: No mediastinal, hilar, or axillary adenopathy. Trachea and esophagus are unremarkable. Thyroid  unremarkable.   Lungs/Pleura: No confluent airspace opacities or effusions.   Upper Abdomen: Upper abdominal aortic atherosclerosis. No acute findings.   Musculoskeletal: Chest wall soft tissues are unremarkable. No acute bony abnormality.   Review of the MIP images confirms the above findings.   IMPRESSION: 4.2 cm ascending thoracic aortic aneurysm is stable since prior study. Recommend annual imaging followup by CTA or MRA. This recommendation follows 2010 ACCF/AHA/AATS/ACR/ASA/SCA/SCAI/SIR/STS/SVM Guidelines for the Diagnosis and Management of Patients with Thoracic Aortic Disease. Circulation. 2010; 121: [id number]. Aortic aneurysm NOS (ICD10-I71.9)   Coronary artery disease.   No acute cardiopulmonary disease.   Aortic Atherosclerosis (ICD10-I70.0).     Electronically Signed   By: Franky Crease M.D.   On: 09/18/2024 12:50     A/P:  Aneurysm of ascending aorta without rupture -4.2 cm ascending thoracic aortic aneurysm on CTA of chest. -We discussed the natural history and and risk factors for growth of ascending aortic aneurysms. Discussed recommendations to minimize the risk of further expansion or dissection including careful blood pressure control, avoidance of contact sports and heavy lifting, attention to lipid management.  We covered the importance of staying never user of tobacco.  The patient does not yet meet surgical criteria of >5.5cm. The patient is aware of signs and symptoms of aortic dissection and when to present to the emergency department     -Follow up in one year with CTA of chest for continued surveillance    Risk Modification:  Statin:  simvastatin  Smoking cessation instruction/counseling given:  never user  Patient was counseled on importance of Blood Pressure Control  They are instructed to contact their Primary Care Physician  if they start to have blood pressure readings over 130s/90s. Do not ever stop blood pressure medications on your own, unless instructed by healthcare professional.  Please avoid use of Fluoroquinolones as this can potentially increase your risk of Aortic Rupture and/or Dissection  Patient educated on signs and symptoms of Aortic Dissection, handout also provided in AVS  Manuelita CHRISTELLA Rough, PA-C 09/25/24

## 2024-09-27 DIAGNOSIS — M19042 Primary osteoarthritis, left hand: Secondary | ICD-10-CM | POA: Diagnosis not present

## 2024-09-27 DIAGNOSIS — I10 Essential (primary) hypertension: Secondary | ICD-10-CM | POA: Diagnosis not present

## 2024-09-27 DIAGNOSIS — G301 Alzheimer's disease with late onset: Secondary | ICD-10-CM | POA: Diagnosis not present

## 2024-09-27 DIAGNOSIS — E7800 Pure hypercholesterolemia, unspecified: Secondary | ICD-10-CM | POA: Diagnosis not present

## 2024-10-10 DIAGNOSIS — R202 Paresthesia of skin: Secondary | ICD-10-CM | POA: Diagnosis not present

## 2024-10-10 DIAGNOSIS — R0789 Other chest pain: Secondary | ICD-10-CM | POA: Diagnosis not present

## 2024-10-10 DIAGNOSIS — K219 Gastro-esophageal reflux disease without esophagitis: Secondary | ICD-10-CM | POA: Diagnosis not present

## 2024-10-10 DIAGNOSIS — R040 Epistaxis: Secondary | ICD-10-CM | POA: Diagnosis not present

## 2024-10-10 DIAGNOSIS — I10 Essential (primary) hypertension: Secondary | ICD-10-CM | POA: Diagnosis not present

## 2024-10-10 DIAGNOSIS — R209 Unspecified disturbances of skin sensation: Secondary | ICD-10-CM | POA: Diagnosis not present

## 2024-10-13 NOTE — Progress Notes (Signed)
 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 Patient is currently on donepezil 10 mg daily by PCP and memantine   5 mg bid, tolerating well.  Memory is  *** with MMSE today at  /30.  Patient is still able to participate on ADLs***and to drive long distances  without significant difficulties.  Mood is good and remains active.***    Follow up in   months. Continue Continue donepezil 10 mg daily and memantine  5 mg twice daily.  Side effects discussed  Repeat neuropsychological evaluations for diagnostic clarity and disease trajectory  Continue to monitor chronic tremors without any other parkinsonian signs. Recommend good control of her cardiovascular risk factors Continue to control mood as per PCP     Subjective:    This patient is accompanied in the office by his wife*** who supplements the history.  Previous records as well as any outside records available were reviewed prior to todays visit. Patient was last seen on 04/11/24 with MMSE 23/30***   Any changes in memory since last visit? . Sometimes he forgets recent conversations or names, new information. HE tries to visualize the information for better recall. He continues to do puzzles and to read extensively, attending classes, musical programs, etc.  repeats oneself?  Endorsed, especially stories.  Disoriented when walking into a room? Denies ***  Leaving objects?  May misplace things but not in unusual places***  Wandering behavior?  denies   Any personality changes since last visit?  Denies.   Any worsening depression?:  Denies.   Hallucinations or paranoia?  Denies.   Seizures? denies    Any sleep changes?  Sleeps well but in sections, may wake up at 1 am and then go back to  sleep. Denies vivid dreams, REM behavior or sleepwalking   Sleep apnea?   Denies.   Any hygiene concerns? Denies.  Independent of bathing and dressing?  Endorsed  Does the patient needs help with medications?  Wife  is in charge *** Who is in charge of the finances?  Wife is in charge   *** Any changes in appetite?  Denies, drinks adequate amount of water.  ***   Patient have trouble swallowing? Denies.   Does the patient cook? No Any headaches?   denies   Any vision changes?*** Chronic arthritic pain, but tries to remain active including swimming regularly and staying fit.  Ambulates with difficulty? Denies.  Likes going to [employer], has a house there, also does stretch, strength, balance classes and swimming. *** Recent falls or head injuries? Denies.     Unilateral weakness, numbness or tingling? denies   Any tremors?  HIStory of B hand tremor, L>R without affecting his ADLs, no other parkinsonian signs*** Any anosmia?  Denies   Any incontinence of urine?  Endorsed***  Any bowel dysfunction?   Denies      Patient lives at [facility] independent living *** Does the patient drive? Yes, has a vacation [place] and drives to there without difficulty ***    History on Initial Assessment 12/07/2020: This is an 83 year old right-handed man with a history of hypertension, hyperlipidemia, presenting for evaluation of memory loss. He started reporting concerns in 2015, MMSE 30/30 at PCP  office. Repeat MMSE in 01/2020 at Dr. Annis office was 27/30. He and his wife wanted to start medication and he has been taking Donepezil 10mg  daily for the past 4-5 years without side effects.   He states he is having issues with his memory, he cannot remember where he put things. His wife started noticing changes with his short-term memory 7-8 years ago, he would repeat the same question and forget details of her answer. There has been a fairly slow progression but  changes have been more pronounced over the past year or so. He continues to drive without getting lost, his wife denies any driving concerns. He manages his own medications without difficulties. His wife manages finances, he had been doing the bigger investments and taxes and can still do it. He keeps repeating that he has become more dependent on her to do these things. No word-finding difficulties. He is independent with dressing and bathing, no hygiene concerns. He has noticed more difficulties reading, he has a hard time keeping track of characters. He used to work in [employer] and denies any issues using the computer. He states mood is generally good, his wife notes he gets more frustrated now when he cannot remember something. No paranoia or hallucinations.   He denies any headaches, dizziness, diplopia, dysarthria, dysphagia, neck/back pain, focal numbness/tingling/weakness, bowel/bladder dysfunction, anosmia, no falls. He has had hand tremors for many years. He sleeps fine, but sometimes wakes up a few times and thinks about something. He walks it off and goes back to sleep. He dozes off in the day sometimes. His wife notes snoring, no REM behavior disorder. His maternal grandmother had dementia in her 26s. He denies any significant head injuries. He and his wife split a bottle of wine every night, he drinks 1.5-2 oz of scotch sometimes. They moved to [place] 3 years ago.        Neuropsych evaluation 09/27/2023   Briefly, results suggested severe impairment surrounding all aspects of learning and memory. An additional impairment was exhibited across semantic fluency, while performance variability was exhibited across processing speed and complex attention. Relative to his previous evaluation in January 2022, decline could be argued across processing speed, complex attention, and aspects of verbal memory. However, in many cases decline is quite subtle. Notable improvements were exhibited across  visuospatial tasks. All other domains exhibited relative stability. Regarding etiology, I continue to have primary concerns surrounding underlying Alzheimer's disease. Across memory testing, Mr. Knutzen was fully amnestic (i.e., 0% retention) across all memory tasks after brief delays and generally performed poorly across yes/no recognition trials. This suggests evidence for rapid forgetting and an evolving and already quite severe storage impairment, both of which are the hallmark testing patterns of this illness. Further impairment surrounding semantic fluency would follow typical disease progression. His degree of alcohol consumption may be playing a role in exacerbating memory dysfunction and overall cognitive decline. 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  PREVIOUS MEDICATIONS:   CURRENT MEDICATIONS:  Outpatient Encounter Medications as of 10/14/2024  Medication Sig   amLODIPine Besylate-Celecoxib 2.5-200 MG TABS Take by mouth.   atenolol (TENORMIN) 25 MG tablet Take 25 mg by mouth daily.   cholecalciferol (VITAMIN D3) 25 MCG (1000 UNIT) tablet Take 1,000 Units by mouth daily.   donepezil (ARICEPT) 10 MG tablet Take 10 mg by mouth daily.   loratadine (CLARITIN) 10 MG  tablet Take 10 mg by mouth daily.   losartan (COZAAR) 50 MG tablet Take 50 mg by mouth daily.   memantine  (NAMENDA ) 5 MG tablet Take 1 tablet (5 mg at night) for 2 weeks, then increase to 1 tablet (5 mg) twice a day   Multiple Vitamin (MULTIVITAMIN) tablet Take 1 tablet by mouth daily.   Omega-3 Fatty Acids (FISH OIL) 1000 MG CAPS Take 1,000 mg by mouth daily.   omeprazole (PRILOSEC) 20 MG capsule Take 20 mg by mouth 2 (two) times a week. Pt takes every Monday and Friday   simvastatin (ZOCOR) 10 MG tablet Take 10 mg by mouth daily.   No facility-administered  encounter medications on file as of 10/14/2024.       04/11/2024    5:00 PM 03/29/2023   12:00 PM 04/05/2022   12:00 PM  MMSE - Mini Mental State Exam  Orientation to time 2 4 5   Orientation to Place 5 5 4   Registration 3 3 3   Attention/ Calculation 5 5 5   Recall 0 0 0  Language- name 2 objects 2 2 2   Language- repeat 1 1 1   Language- follow 3 step command 3 3 3   Language- read & follow direction 1 1 1   Write a sentence 1 1 1   Copy design 0 1 1  Total score 23 26 26       12/07/2020   10:00 AM  Montreal Cognitive Assessment   Visuospatial/ Executive (0/5) 3  Naming (0/3) 3  Attention: Read list of digits (0/2) 2  Attention: Read list of letters (0/1) 1  Attention: Serial 7 subtraction starting at 100 (0/3) 3  Language: Repeat phrase (0/2) 2  Language : Fluency (0/1) 0  Abstraction (0/2) 2  Delayed Recall (0/5) 0  Orientation (0/6) 4  Total 20    Objective:    Neurological Exam:    VITALS:  There were no vitals filed for this visit.  GEN:  The patient appears stated age and is in NAD. HEENT:  Normocephalic, atraumatic.   Neurological examination:  General: NAD, well-groomed, appears stated age. Orientation: The patient is alert. Oriented to person, place and date Cranial nerves: There is good facial symmetry.The speech is fluent and clear. No aphasia or dysarthria. Fund of knowledge is appropriate. Recent and remote memory are impaired. Attention and concentration are reduced. Able to name objects and repeat phrases.  Hearing is intact to conversational tone. *** Sensation: Sensation is intact to light touch throughout Motor: Strength is at least antigravity x4. DTR's 2/4 in UE/LE     Movement examination: Tone: There is normal tone in the UE/LE Abnormal movements:  B intention L>R without any other parkinsonian featurestremor.  No myoclonus.  No asterixis.   Coordination:  There is no decremation with RAM's. Normal finger to nose  Gait and Station: The patient  has no*** difficulty arising out of a deep-seated chair without the use of the hands. The patient's stride length is good.  Gait is cautious and narrow.    Thank you for allowing us  the opportunity to participate in the care of this nice patient. Please do not hesitate to contact us  for any questions or concerns.   Total time spent on today's visit was *** minutes dedicated to this patient today, preparing to see patient, examining the patient, ordering tests and/or medications and counseling the patient, documenting clinical information in the EHR or other health record, independently interpreting results and communicating results to the patient/family, discussing treatment and  goals, answering patient's questions and coordinating care.  Cc:  Dwight Trula SQUIBB, MD  Camie Sevin 10/13/2024 6:23 PM

## 2024-10-14 ENCOUNTER — Encounter: Payer: Self-pay | Admitting: Physician Assistant

## 2024-10-14 ENCOUNTER — Ambulatory Visit (INDEPENDENT_AMBULATORY_CARE_PROVIDER_SITE_OTHER): Admitting: Physician Assistant

## 2024-10-14 VITALS — BP 175/80 | HR 44 | Resp 20 | Ht 70.0 in | Wt 149.0 lb

## 2024-10-14 DIAGNOSIS — G3184 Mild cognitive impairment, so stated: Secondary | ICD-10-CM

## 2024-10-14 DIAGNOSIS — G250 Essential tremor: Secondary | ICD-10-CM | POA: Diagnosis not present

## 2024-10-14 NOTE — Patient Instructions (Signed)
 Good to see you!  Continue Donepezil 10mg  daily Continue Memantine  5mg  tablets twice daily.   3    Follow-up in 6 months    FALL PRECAUTIONS: Be cautious when walking. Scan the area for obstacles that may increase the risk of trips and falls. When getting up in the mornings, sit up at the edge of the bed for a few minutes before getting out of bed. Consider elevating the bed at the head end to avoid drop of blood pressure when getting up. Walk always in a well-lit room (use night lights in the walls). Avoid area rugs or power cords from appliances in the middle of the walkways. Use a walker or a cane if necessary and consider physical therapy for balance exercise. Get your eyesight checked regularly.  FINANCIAL OVERSIGHT: Supervision, especially oversight when making financial decisions or transactions is also recommended.  HOME SAFETY: Consider the safety of the kitchen when operating appliances like stoves, microwave oven, and blender. Consider having supervision and share cooking responsibilities until no longer able to participate in those. Accidents with firearms and other hazards in the house should be identified and addressed as well.  DRIVING: Regarding driving, in patients with progressive memory problems, driving will be impaired. We advise to have someone else do the driving if trouble finding directions or if minor accidents are reported. Independent driving assessment is available to determine safety of driving.  ABILITY TO BE LEFT ALONE: If patient is unable to contact 911 operator, consider using LifeLine, or when the need is there, arrange for someone to stay with patients. Smoking is a fire hazard, consider supervision or cessation. Risk of wandering should be assessed by caregiver and if detected at any point, supervision and safe proof recommendations should be instituted.  MEDICATION SUPERVISION: Inability to self-administer medication needs to be constantly addressed. Implement  a mechanism to ensure safe administration of the medications.  RECOMMENDATIONS FOR ALL PATIENTS WITH MEMORY PROBLEMS: 1. Continue to exercise (Recommend 30 minutes of walking everyday, or 3 hours every week) 2. Increase social interactions - continue going to [place] and enjoy social gatherings with friends and family 3. Eat healthy, avoid fried foods and eat more fruits and vegetables 4. Maintain adequate blood pressure, blood sugar, and blood cholesterol level. Reducing the risk of stroke and cardiovascular disease also helps promoting better memory. 5. Avoid stressful situations. Live a simple life and avoid aggravations. Organize your time and prepare for the next day in anticipation. 6. Sleep well, avoid any interruptions of sleep and avoid any distractions in the bedroom that may interfere with adequate sleep quality 7. Avoid sugar, avoid sweets as there is a strong link between excessive sugar intake, diabetes, and cognitive impairment We discussed the Mediterranean diet, which has been shown to help patients reduce the risk of progressive memory disorders and reduces cardiovascular risk. This includes eating fish, eat fruits and green leafy vegetables, nuts like almonds and hazelnuts, walnuts, and also use olive oil. Avoid fast foods and fried foods as much as possible. Avoid sweets and sugar as sugar use has been linked to worsening of memory function.  There is always a concern of gradual progression of memory problems. If this is the case, then we may need to adjust level of care according to patient needs. Support, both to the patient and caregiver, should then be put into place.

## 2024-10-27 DIAGNOSIS — I10 Essential (primary) hypertension: Secondary | ICD-10-CM | POA: Diagnosis not present

## 2024-10-27 DIAGNOSIS — G301 Alzheimer's disease with late onset: Secondary | ICD-10-CM | POA: Diagnosis not present

## 2024-10-27 DIAGNOSIS — E7800 Pure hypercholesterolemia, unspecified: Secondary | ICD-10-CM | POA: Diagnosis not present

## 2024-10-27 DIAGNOSIS — M19042 Primary osteoarthritis, left hand: Secondary | ICD-10-CM | POA: Diagnosis not present

## 2024-11-02 DIAGNOSIS — J069 Acute upper respiratory infection, unspecified: Secondary | ICD-10-CM | POA: Diagnosis not present

## 2024-11-14 DIAGNOSIS — R0981 Nasal congestion: Secondary | ICD-10-CM | POA: Diagnosis not present

## 2024-11-14 DIAGNOSIS — H6692 Otitis media, unspecified, left ear: Secondary | ICD-10-CM | POA: Diagnosis not present

## 2024-11-14 DIAGNOSIS — H6593 Unspecified nonsuppurative otitis media, bilateral: Secondary | ICD-10-CM | POA: Diagnosis not present

## 2025-04-15 ENCOUNTER — Ambulatory Visit: Admitting: Physician Assistant
